# Patient Record
Sex: Female | Born: 1975 | Race: Black or African American | Hispanic: No | Marital: Single | Smoking: Former smoker
Health system: Southern US, Community
[De-identification: ages and names within clinical notes are randomized; demographics above are authoritative.]

## PROBLEM LIST (undated history)

## (undated) DIAGNOSIS — I10 Essential (primary) hypertension: Secondary | ICD-10-CM

## (undated) DIAGNOSIS — D500 Iron deficiency anemia secondary to blood loss (chronic): Secondary | ICD-10-CM

## (undated) DIAGNOSIS — N920 Excessive and frequent menstruation with regular cycle: Secondary | ICD-10-CM

## (undated) DIAGNOSIS — D259 Leiomyoma of uterus, unspecified: Secondary | ICD-10-CM

## (undated) DIAGNOSIS — Z973 Presence of spectacles and contact lenses: Secondary | ICD-10-CM

---

## 2007-06-27 ENCOUNTER — Emergency Department (HOSPITAL_COMMUNITY): Admission: EM | Admit: 2007-06-27 | Discharge: 2007-06-27 | Payer: Self-pay | Admitting: Family Medicine

## 2007-08-19 ENCOUNTER — Emergency Department (HOSPITAL_COMMUNITY): Admission: EM | Admit: 2007-08-19 | Discharge: 2007-08-19 | Payer: Self-pay | Admitting: Emergency Medicine

## 2009-10-30 ENCOUNTER — Emergency Department (HOSPITAL_COMMUNITY): Admission: EM | Admit: 2009-10-30 | Discharge: 2009-10-30 | Payer: Self-pay | Admitting: Emergency Medicine

## 2010-08-19 LAB — URINALYSIS, ROUTINE W REFLEX MICROSCOPIC
Glucose, UA: NEGATIVE mg/dL
Ketones, ur: 40 mg/dL — AB
Leukocytes, UA: NEGATIVE
Nitrite: NEGATIVE
Specific Gravity, Urine: 1.024 (ref 1.005–1.030)
pH: 6 (ref 5.0–8.0)

## 2010-08-19 LAB — POCT PREGNANCY, URINE: Preg Test, Ur: NEGATIVE

## 2010-08-19 LAB — URINE MICROSCOPIC-ADD ON

## 2010-08-19 LAB — RAPID STREP SCREEN (MED CTR MEBANE ONLY): Streptococcus, Group A Screen (Direct): NEGATIVE

## 2011-02-24 LAB — I-STAT 8, (EC8 V) (CONVERTED LAB)
Acid-Base Excess: 1
BUN: 6
Chloride: 104
Operator id: 295021
Sodium: 139
TCO2: 29
pCO2, Ven: 48.9
pH, Ven: 7.355 — ABNORMAL HIGH

## 2011-02-24 LAB — DIFFERENTIAL
Basophils Absolute: 0
Basophils Relative: 1
Eosinophils Absolute: 0.1
Eosinophils Relative: 2
Monocytes Relative: 8
Neutro Abs: 3.7
Neutrophils Relative %: 50

## 2011-02-24 LAB — URINALYSIS, ROUTINE W REFLEX MICROSCOPIC
Glucose, UA: NEGATIVE
Hgb urine dipstick: NEGATIVE
Ketones, ur: NEGATIVE
Protein, ur: NEGATIVE
Urobilinogen, UA: 2 — ABNORMAL HIGH

## 2011-02-24 LAB — POCT PREGNANCY, URINE: Operator id: 295021

## 2011-02-24 LAB — CBC: HCT: 40.6

## 2011-02-24 LAB — POCT I-STAT CREATININE: Creatinine, Ser: 1.1

## 2011-02-24 LAB — URINE MICROSCOPIC-ADD ON

## 2011-05-06 ENCOUNTER — Encounter: Payer: Self-pay | Admitting: *Deleted

## 2011-05-06 ENCOUNTER — Emergency Department (INDEPENDENT_AMBULATORY_CARE_PROVIDER_SITE_OTHER)
Admission: EM | Admit: 2011-05-06 | Discharge: 2011-05-06 | Disposition: A | Discharge status: Home / Self Care | Payer: Self-pay | Source: Home / Self Care | Attending: Family Medicine | Admitting: Family Medicine

## 2011-05-06 DIAGNOSIS — J209 Acute bronchitis, unspecified: Secondary | ICD-10-CM

## 2011-05-06 MED ORDER — AMOXICILLIN-POT CLAVULANATE 875-125 MG PO TABS: 1.0000 | ORAL_TABLET | Freq: Two times a day (BID) | ORAL | Status: AC

## 2011-05-06 MED ORDER — DM-GUAIFENESIN ER 60-1200 MG PO TB12: 1.0000 | ORAL_TABLET | Freq: Two times a day (BID) | ORAL | Status: AC

## 2011-05-06 NOTE — ED Provider Notes (Signed)
History     CSN: [id number] Arrival date & time: 05/06/2011  8:59 AM   First MD Initiated Contact with Patient 05/06/11 [phone]      Chief Complaint  Patient presents with  . URI    (Consider location/radiation/quality/duration/timing/severity/associated sxs/prior treatment) Patient is a 35 y.o. female presenting with URI. The history is provided by the patient.  URI The primary symptoms include sore throat and cough. Primary symptoms do not include fever, nausea or vomiting. The current episode started 3 to 5 days ago. This is a new problem.  Symptoms associated with the illness include congestion and rhinorrhea.    History reviewed. No pertinent past medical history.  Past Surgical History  Procedure Date  . Cesarean section   . Tubal ligation     No family history on file.  History  Substance Use Topics  . Smoking status: Current Everyday Smoker -- 0.5 packs/day  . Smokeless tobacco: Not on file  . Alcohol Use: Yes     social    OB History    Grav Para Term Preterm Abortions TAB SAB Ect Mult Living                  Review of Systems  Constitutional: Negative.  Negative for fever.  HENT: Positive for congestion, sore throat, rhinorrhea and postnasal drip.   Respiratory: Positive for cough.   Cardiovascular: Negative.   Gastrointestinal: Negative.  Negative for nausea and vomiting.  Skin: Negative.     Allergies  Review of patient's allergies indicates no known allergies.  Home Medications   Current Outpatient Rx  Name Route Sig Dispense Refill  . ALKA-SELTZER PLUS COLD & COUGH PO Oral Take by mouth.        BP 119/74  Pulse 74  Temp(Src) 98.4 F (36.9 C) (Oral)  Resp 16  SpO2 100%  LMP 04/22/2011  Physical Exam  Nursing note and vitals reviewed. Constitutional: She appears well-developed and well-nourished.  HENT:  Head: Normocephalic.  Right Ear: External ear normal.  Left Ear: External ear normal.  Nose: Nose normal.  Mouth/Throat:  Oropharynx is clear and moist.  Eyes: Pupils are equal, round, and reactive to light.  Neck: Normal range of motion. Neck supple.  Cardiovascular: Normal rate, regular rhythm, normal heart sounds and intact distal pulses.   Pulmonary/Chest: Effort normal and breath sounds normal.  Abdominal: Soft.  Skin: Skin is warm and dry.    ED Course  Procedures (including critical care time)  Labs Reviewed - No data to display No results found.   No diagnosis found.    MDM          Barkley Bruns, MD 05/06/11 1004

## 2011-05-06 NOTE — ED Notes (Signed)
Pt c/o productive cough, sinus congestion, sorethroat, bodyaches, chills onset 4 days ago.  States it was a gradual onset.  Throat red, not swollen and no exudate noted.

## 2013-06-06 ENCOUNTER — Encounter (HOSPITAL_COMMUNITY): Payer: Self-pay | Admitting: Emergency Medicine

## 2013-06-06 ENCOUNTER — Emergency Department (HOSPITAL_COMMUNITY)
Admission: EM | Admit: 2013-06-06 | Discharge: 2013-06-06 | Disposition: A | Discharge status: Home / Self Care | Payer: Self-pay | Attending: Emergency Medicine | Admitting: Emergency Medicine

## 2013-06-06 DIAGNOSIS — K047 Periapical abscess without sinus: Secondary | ICD-10-CM | POA: Insufficient documentation

## 2013-06-06 DIAGNOSIS — H9209 Otalgia, unspecified ear: Secondary | ICD-10-CM | POA: Insufficient documentation

## 2013-06-06 DIAGNOSIS — H748X9 Other specified disorders of middle ear and mastoid, unspecified ear: Secondary | ICD-10-CM | POA: Insufficient documentation

## 2013-06-06 DIAGNOSIS — F17200 Nicotine dependence, unspecified, uncomplicated: Secondary | ICD-10-CM | POA: Insufficient documentation

## 2013-06-06 MED ORDER — OXYCODONE-ACETAMINOPHEN 5-325 MG PO TABS: 1.0000 | ORAL_TABLET | Freq: Four times a day (QID) | ORAL | Status: DC | PRN

## 2013-06-06 MED ORDER — PENICILLIN V POTASSIUM 500 MG PO TABS: 1000.0000 mg | ORAL_TABLET | Freq: Two times a day (BID) | ORAL | Status: DC

## 2013-06-06 MED ORDER — IBUPROFEN 400 MG PO TABS
800.0000 mg | ORAL_TABLET | Freq: Once | ORAL | Status: AC
Start: 2013-06-06 — End: 2013-06-06
  Administered 2013-06-06: 800 mg via ORAL
  Filled 2013-06-06: qty 2

## 2013-06-06 NOTE — ED Provider Notes (Signed)
CSN: [id number]     Arrival date & time 06/06/13  1026 History  This chart was scribed for Jean Rack, PA-C, working with Richarda Blade, MD by Maree Erie, ED Scribe. This patient was seen in room TR04C/TR04C and the patient's care was started at 1:17 PM.    Chief Complaint  Patient presents with  . dental abscess     The history is provided by the patient. No language interpreter was used.    HPI Comments: Jean Ferguson is a 38 y.o. female who presents to the Emergency Department complaining of a constant, worsening dental abscess that appeared three days ago. She reports the swelling began in her gums but now affects the left cheek. She describes the pain as aching and rates it as a 8/10 currently. The pain is worsened by chewing as well as cold and hot food and drink. She has been taking pain medication without relief.  She states that she has a prior history of dental abscesses. She is usually discharged with antibiotics and pain medication which she reports relieves symptoms. She is also complaining of right sided ear pain. She denies fever, chills or throat swelling. She states that she is allergic to morphine.    History reviewed. No pertinent past medical history. Past Surgical History  Procedure Laterality Date  . Cesarean section    . Tubal ligation     No family history on file. History  Substance Use Topics  . Smoking status: Current Every Day Smoker -- 0.50 packs/day    Types: Cigarettes  . Smokeless tobacco: Not on file  . Alcohol Use: Yes     Comment: social   OB History   Grav Para Term Preterm Abortions TAB SAB Ect Mult Living                 Review of Systems  Constitutional: Negative for fever and chills.  HENT: Positive for dental problem and facial swelling. Negative for trouble swallowing.   All other systems reviewed and are negative.    Allergies  Morphine and related  Home Medications   Current Outpatient Rx  Name  Route  Sig   Dispense  Refill  . oxyCODONE-acetaminophen (PERCOCET) 5-325 MG per tablet   Oral   Take 1-2 tablets by mouth every 6 (six) hours as needed for severe pain.   15 tablet   0   . penicillin v potassium (VEETID) 500 MG tablet   Oral   Take 2 tablets (1,000 mg total) by mouth 2 (two) times daily. X 7 days   28 tablet   0    Triage Vitals: BP 131/72  Pulse 80  Temp(Src) 98.1 F (36.7 C) (Oral)  Resp 18  Wt 145 lb (65.772 kg)  SpO2 100%  LMP 06/05/2013  Physical Exam  Nursing note and vitals reviewed. Constitutional: She is oriented to person, place, and time. She appears well-developed and well-nourished. No distress.  HENT:  Head: Normocephalic and atraumatic.  Right Ear: Ear canal normal. Tympanic membrane is not injected and not erythematous. A middle ear effusion is present.  Left Ear: Tympanic membrane is not injected and not erythematous. A middle ear effusion is present.  Nose: Nose normal.  Mouth/Throat:     Submandibular adenopathy left side. No drainage at site of abscess in mouth.   Eyes: EOM are normal.  Neck: Neck supple. No tracheal deviation present.  Cardiovascular: Normal rate, regular rhythm and normal heart sounds.   Pulmonary/Chest: Effort normal  and breath sounds normal. No respiratory distress. She has no wheezes. She has no rales.  Musculoskeletal: Normal range of motion.  Lymphadenopathy:       Head (left side): Submandibular adenopathy present.  Neurological: She is alert and oriented to person, place, and time.  Skin: Skin is warm and dry.  Psychiatric: She has a normal mood and affect. Her behavior is normal.    ED Course  Procedures (including critical care time) DIAGNOSTIC STUDIES: Oxygen Saturation is 100% on room air, normal by my interpretation.    COORDINATION OF CARE: 1:21 PM -Will perform dental block. Patient verbalizes understanding and agrees with treatment plan.  1:45 PM -Performed dental block and I&D. Will give patient 800 mg  Ibuprofen and a prescription for antibiotics and pain medication. Patient verbalizes understanding and agrees with treatment plan.  INCISION AND DRAINAGE Performed by: Sherrie George Consent: Verbal consent obtained. Risks and benefits: risks, benefits and alternatives were discussed Type: dental abscess  Body area: Junction between Hard palate and Gum line on Left Side  Anesthesia: local infiltration   Incision was made with a scalpel.  Local anesthetic: Marcaine   Anesthetic total: 1 ml  Drainage: purulent  Drainage amount: 1 mL  Patient tolerance: Patient tolerated the procedure well with no immediate complications.     Labs Review Labs Reviewed - No data to display Imaging Review No results found.  EKG Interpretation   None       MDM   1. Dental abscess    Patient afebrile. VS WNL. Patient tolerated procedure well. Patient pain treated in ED. Discharged home with antibiotics and pain meds, with instructions to follow up with "on call" Dentist.  Patient agrees with plan.    Meds given in ED:  Medications  ibuprofen (ADVIL,MOTRIN) tablet 800 mg (800 mg Oral Given 06/06/13 1357)    Discharge Medication List as of 06/06/2013  1:54 PM    START taking these medications   Details  oxyCODONE-acetaminophen (PERCOCET) 5-325 MG per tablet Take 1-2 tablets by mouth every 6 (six) hours as needed for severe pain., Starting 06/06/2013, Until Discontinued, Print    penicillin v potassium (VEETID) 500 MG tablet Take 2 tablets (1,000 mg total) by mouth 2 (two) times daily. X 7 days, Starting 06/06/2013, Until Discontinued, Print         I personally performed the services described in this documentation, which was scribed in my presence. The recorded information has been reviewed and is accurate.    Sherrie George, PA-C 06/06/13 2127

## 2013-06-06 NOTE — ED Notes (Signed)
Patient states dental abscess upper L side.   Patient states no drainage, just swelling at this time.

## 2013-06-06 NOTE — ED Notes (Signed)
C/o left upper dental pain x 2 days. States has "a mouthful of broken teeth".

## 2013-06-08 NOTE — ED Provider Notes (Signed)
Medical screening examination/treatment/procedure(s) were performed by non-physician practitioner and as supervising physician I was immediately available for consultation/collaboration.  EKG Interpretation   None        Richarda Blade, MD 06/08/13 1712

## 2015-08-01 ENCOUNTER — Encounter (HOSPITAL_COMMUNITY): Payer: Self-pay | Admitting: Family Medicine

## 2015-08-01 ENCOUNTER — Emergency Department (HOSPITAL_COMMUNITY)
Admission: EM | Admit: 2015-08-01 | Discharge: 2015-08-01 | Disposition: A | Discharge status: Home / Self Care | Payer: BLUE CROSS/BLUE SHIELD | Attending: Emergency Medicine | Admitting: Emergency Medicine

## 2015-08-01 DIAGNOSIS — F17210 Nicotine dependence, cigarettes, uncomplicated: Secondary | ICD-10-CM | POA: Insufficient documentation

## 2015-08-01 DIAGNOSIS — J029 Acute pharyngitis, unspecified: Secondary | ICD-10-CM | POA: Diagnosis present

## 2015-08-01 DIAGNOSIS — J020 Streptococcal pharyngitis: Secondary | ICD-10-CM | POA: Diagnosis not present

## 2015-08-01 DIAGNOSIS — Z3202 Encounter for pregnancy test, result negative: Secondary | ICD-10-CM | POA: Diagnosis not present

## 2015-08-01 LAB — URINALYSIS, ROUTINE W REFLEX MICROSCOPIC
GLUCOSE, UA: NEGATIVE mg/dL
KETONES UR: NEGATIVE mg/dL
Leukocytes, UA: NEGATIVE
Nitrite: NEGATIVE
PROTEIN: 30 mg/dL — AB
Specific Gravity, Urine: >1.046 — ABNORMAL HIGH (ref 1.005–1.030)
pH: 6 (ref 5.0–8.0)

## 2015-08-01 LAB — COMPREHENSIVE METABOLIC PANEL
ALBUMIN: 3.2 g/dL — AB (ref 3.5–5.0)
ALK PHOS: 61 U/L (ref 38–126)
ALT: 17 U/L (ref 14–54)
ANION GAP: 11 (ref 5–15)
AST: 27 U/L (ref 15–41)
BILIRUBIN TOTAL: 0.3 mg/dL (ref 0.3–1.2)
BUN: 6 mg/dL (ref 6–20)
CALCIUM: 8.6 mg/dL — AB (ref 8.9–10.3)
CO2: 22 mmol/L (ref 22–32)
Chloride: 106 mmol/L (ref 101–111)
Creatinine, Ser: 0.76 mg/dL (ref 0.44–1.00)
GFR calc Af Amer: >60 mL/min (ref >60)
GFR calc non Af Amer: >60 mL/min (ref >60)
GLUCOSE: 99 mg/dL (ref 65–99)
Potassium: 3.6 mmol/L (ref 3.5–5.1)
Sodium: 139 mmol/L (ref 135–145)
TOTAL PROTEIN: 7 g/dL (ref 6.5–8.1)

## 2015-08-01 LAB — CBC WITH DIFFERENTIAL/PLATELET
BASOS PCT: 0 %
Basophils Absolute: 0 10*3/uL (ref 0.0–0.1)
Eosinophils Absolute: 0.1 10*3/uL (ref 0.0–0.7)
Eosinophils Relative: 0 %
HEMATOCRIT: 32.3 % — AB (ref 36.0–46.0)
HEMOGLOBIN: 9.9 g/dL — AB (ref 12.0–15.0)
LYMPHS PCT: 10 %
Lymphs Abs: 1.4 10*3/uL (ref 0.7–4.0)
MCH: 23.2 pg — ABNORMAL LOW (ref 26.0–34.0)
MCHC: 30.7 g/dL (ref 30.0–36.0)
MCV: 75.6 fL — ABNORMAL LOW (ref 78.0–100.0)
MONOS PCT: 9 %
Monocytes Absolute: 1.3 10*3/uL — ABNORMAL HIGH (ref 0.1–1.0)
NEUTROS ABS: 11.3 10*3/uL — AB (ref 1.7–7.7)
NEUTROS PCT: 81 %
Platelets: 341 10*3/uL (ref 150–400)
RBC: 4.27 MIL/uL (ref 3.87–5.11)
RDW: 18.3 % — ABNORMAL HIGH (ref 11.5–15.5)
WBC: 14.1 10*3/uL — ABNORMAL HIGH (ref 4.0–10.5)

## 2015-08-01 LAB — URINE MICROSCOPIC-ADD ON

## 2015-08-01 LAB — RAPID STREP SCREEN (MED CTR MEBANE ONLY): Streptococcus, Group A Screen (Direct): POSITIVE — AB

## 2015-08-01 LAB — POC URINE PREG, ED: Preg Test, Ur: NEGATIVE

## 2015-08-01 MED ORDER — SODIUM CHLORIDE 0.9 % IV BOLUS (SEPSIS)
1000.0000 mL | Freq: Once | INTRAVENOUS | Status: AC
  Administered 2015-08-01: 1000 mL via INTRAVENOUS

## 2015-08-01 MED ORDER — AMOXICILLIN-POT CLAVULANATE 875-125 MG PO TABS
1.0000 | ORAL_TABLET | Freq: Once | ORAL | Status: AC
  Administered 2015-08-01: 1 via ORAL
  Filled 2015-08-01: qty 1

## 2015-08-01 MED ORDER — AMOXICILLIN-POT CLAVULANATE 875-125 MG PO TABS: 1.0000 | ORAL_TABLET | Freq: Two times a day (BID) | ORAL | Status: DC

## 2015-08-01 MED ORDER — KETOROLAC TROMETHAMINE 30 MG/ML IJ SOLN
30.0000 mg | Freq: Once | INTRAMUSCULAR | Status: AC
  Administered 2015-08-01: 30 mg via INTRAVENOUS
  Filled 2015-08-01: qty 1

## 2015-08-01 MED ORDER — AMOXICILLIN 500 MG PO CAPS: 1000.0000 mg | ORAL_CAPSULE | Freq: Once | ORAL | Status: DC

## 2015-08-01 NOTE — ED Provider Notes (Signed)
CSN: [id number]     Arrival date & time 08/01/15  [id number] History   First MD Initiated Contact with Patient 08/01/15 [phone]     Chief Complaint  Patient presents with  . Influenza     (Consider location/radiation/quality/duration/timing/severity/associated sxs/prior Treatment) The history is provided by the patient.  Jean Ferguson is a 40 y.o. female with cough, chills, sore throat, vomiting. Patient states that about a week ago, she's been having some cold chills as well as some cough and occasional vomiting. She went out of town to [place] over the weekend and came back 2 days ago and symptoms worsened. Developed sore throat and subjective fevers. Denies abdominal pain or urinary symptoms.    History reviewed. No pertinent past medical history. Past Surgical History  Procedure Laterality Date  . Cesarean section    . Tubal ligation     History reviewed. No pertinent family history. Social History  Substance Use Topics  . Smoking status: Current Every Day Smoker -- 0.50 packs/day    Types: Cigarettes  . Smokeless tobacco: None  . Alcohol Use: Yes     Comment: social   OB History    No data available     Review of Systems  Constitutional: Positive for fever and chills.  HENT: Positive for sore throat.   Respiratory: Positive for cough.   All other systems reviewed and are negative.     Allergies  Morphine and related  Home Medications   Prior to Admission medications   Medication Sig Start Date End Date Taking? Authorizing Provider  oxyCODONE-acetaminophen (PERCOCET) 5-325 MG per tablet Take 1-2 tablets by mouth every 6 (six) hours as needed for severe pain. 06/06/13   Maude Leriche, PA-C  penicillin v potassium (VEETID) 500 MG tablet Take 2 tablets (1,000 mg total) by mouth 2 (two) times daily. X 7 days 06/06/13   Maude Leriche, PA-C   BP 137/80 mmHg  Pulse 67  Temp(Src) 98.4 F (36.9 C) (Oral)  Resp 19  SpO2 100%  LMP 08/01/2015 Physical Exam  Constitutional: She  is oriented to person, place, and time. She appears well-developed and well-nourished.  HENT:  Head: Normocephalic.  Tonsils enlarged with exudates, OP red. Uvula midline   Eyes: Conjunctivae are normal. Pupils are equal, round, and reactive to light.  Neck:  Mild cervical LAD   Cardiovascular: Normal rate, regular rhythm and normal heart sounds.   Pulmonary/Chest: Effort normal and breath sounds normal. No respiratory distress. She has no wheezes. She has no rales.  Abdominal: Soft. Bowel sounds are normal. She exhibits no distension. There is no tenderness. There is no rebound.  Musculoskeletal: Normal range of motion. She exhibits no edema or tenderness.  Neurological: She is alert and oriented to person, place, and time. No cranial nerve deficit. Coordination normal.  Skin: Skin is warm and dry.  Psychiatric: She has a normal mood and affect. Her behavior is normal. Judgment and thought content normal.  Nursing note and vitals reviewed.   ED Course  Procedures (including critical care time) Labs Review Labs Reviewed  RAPID STREP SCREEN (NOT AT [facility]) - Abnormal; Notable for the following:    Streptococcus, Group A Screen (Direct) POSITIVE (*)    All other components within normal limits  CBC WITH DIFFERENTIAL/PLATELET - Abnormal; Notable for the following:    WBC 14.1 (*)    Hemoglobin 9.9 (*)    HCT 32.3 (*)    MCV 75.6 (*)    MCH 23.2 (*)  RDW 18.3 (*)    Neutro Abs 11.3 (*)    Monocytes Absolute 1.3 (*)    All other components within normal limits  COMPREHENSIVE METABOLIC PANEL - Abnormal; Notable for the following:    Calcium 8.6 (*)    Albumin 3.2 (*)    All other components within normal limits  URINALYSIS, ROUTINE W REFLEX MICROSCOPIC (NOT AT [facility]) - Abnormal; Notable for the following:    Color, Urine AMBER (*)    Specific Gravity, Urine >1.046 (*)    Hgb urine dipstick MODERATE (*)    Bilirubin Urine SMALL (*)    Protein, ur 30 (*)    All other components  within normal limits  URINE MICROSCOPIC-ADD ON - Abnormal; Notable for the following:    Squamous Epithelial / LPF 0-5 (*)    Bacteria, UA FEW (*)    All other components within normal limits  POC URINE PREG, ED    Imaging Review No results found. I have personally reviewed and evaluated these images and lab results as part of my medical decision-making.   EKG Interpretation None      MDM   Final diagnoses:  None    Jean Ferguson is a 40 y.o. female here with subjective fevers, chills, cough, sore throat. Moderate centor score. Will get rapid strep. Consider viral syndrome vs strep. Afebrile here. Will get labs, UA, rapid strep, CXR. Will hydrate and reassess.   11:50 AM Patient felt better. WBC 14. Strep positive. Offered bicillin vs amoxicillin PO and prefers PO meds. Stable for dc.    Wandra Arthurs, MD 08/01/15 1150

## 2015-08-01 NOTE — ED Notes (Signed)
Pt here for flu like symptoms. sts started about 2 days ago. sts cold chills, cough, fever, vomiting.

## 2015-08-01 NOTE — Discharge Instructions (Signed)
Take augmentin twice daily for a week.   Stay hydrated.  Take motrin, tylenol for fever.   See your doctor   Return to ER if you have fever for a week, worse sore throat, difficulty swallowing.

## 2016-01-05 ENCOUNTER — Encounter (HOSPITAL_COMMUNITY): Payer: Self-pay | Admitting: *Deleted

## 2016-01-05 ENCOUNTER — Inpatient Hospital Stay (HOSPITAL_COMMUNITY)
Admission: AD | Admit: 2016-01-05 | Discharge: 2016-01-05 | Disposition: A | Discharge status: Home / Self Care | Payer: BLUE CROSS/BLUE SHIELD | Source: Ambulatory Visit | Attending: Family Medicine | Admitting: Family Medicine

## 2016-01-05 DIAGNOSIS — F17210 Nicotine dependence, cigarettes, uncomplicated: Secondary | ICD-10-CM | POA: Insufficient documentation

## 2016-01-05 DIAGNOSIS — D649 Anemia, unspecified: Secondary | ICD-10-CM | POA: Diagnosis not present

## 2016-01-05 DIAGNOSIS — N920 Excessive and frequent menstruation with regular cycle: Secondary | ICD-10-CM | POA: Insufficient documentation

## 2016-01-05 DIAGNOSIS — I10 Essential (primary) hypertension: Secondary | ICD-10-CM | POA: Diagnosis not present

## 2016-01-05 DIAGNOSIS — Z885 Allergy status to narcotic agent status: Secondary | ICD-10-CM | POA: Insufficient documentation

## 2016-01-05 DIAGNOSIS — R0989 Other specified symptoms and signs involving the circulatory and respiratory systems: Secondary | ICD-10-CM

## 2016-01-05 DIAGNOSIS — R42 Dizziness and giddiness: Secondary | ICD-10-CM | POA: Diagnosis not present

## 2016-01-05 DIAGNOSIS — R1110 Vomiting, unspecified: Secondary | ICD-10-CM | POA: Diagnosis present

## 2016-01-05 DIAGNOSIS — D500 Iron deficiency anemia secondary to blood loss (chronic): Secondary | ICD-10-CM

## 2016-01-05 LAB — URINE MICROSCOPIC-ADD ON: BACTERIA UA: NONE SEEN

## 2016-01-05 LAB — CBC WITH DIFFERENTIAL/PLATELET
Basophils Absolute: 0.1 10*3/uL (ref 0.0–0.1)
Basophils Relative: 1 %
Eosinophils Absolute: 0.1 10*3/uL (ref 0.0–0.7)
Eosinophils Relative: 2 %
HCT: 26 % — ABNORMAL LOW (ref 36.0–46.0)
Hemoglobin: 8 g/dL — ABNORMAL LOW (ref 12.0–15.0)
Lymphocytes Relative: 24 %
Lymphs Abs: 1.7 10*3/uL (ref 0.7–4.0)
MCH: 21.6 pg — ABNORMAL LOW (ref 26.0–34.0)
MCHC: 30.8 g/dL (ref 30.0–36.0)
MCV: 70.3 fL — ABNORMAL LOW (ref 78.0–100.0)
Monocytes Absolute: 0.3 10*3/uL (ref 0.1–1.0)
Monocytes Relative: 4 %
Neutro Abs: 5 10*3/uL (ref 1.7–7.7)
Neutrophils Relative %: 69 %
Other: 0 %
Platelets: 333 10*3/uL (ref 150–400)
RBC: 3.7 MIL/uL — ABNORMAL LOW (ref 3.87–5.11)
RDW: 19 % — ABNORMAL HIGH (ref 11.5–15.5)
WBC: 7.2 10*3/uL (ref 4.0–10.5)

## 2016-01-05 LAB — URINALYSIS, ROUTINE W REFLEX MICROSCOPIC
BILIRUBIN URINE: NEGATIVE
GLUCOSE, UA: NEGATIVE mg/dL
KETONES UR: NEGATIVE mg/dL
Leukocytes, UA: NEGATIVE
Nitrite: NEGATIVE
PROTEIN: NEGATIVE mg/dL
Specific Gravity, Urine: 1.02 (ref 1.005–1.030)
pH: 6.5 (ref 5.0–8.0)

## 2016-01-05 LAB — COMPREHENSIVE METABOLIC PANEL
ALT: 13 U/L — ABNORMAL LOW (ref 14–54)
AST: 21 U/L (ref 15–41)
Albumin: 3.6 g/dL (ref 3.5–5.0)
Alkaline Phosphatase: 46 U/L (ref 38–126)
Anion gap: 6 (ref 5–15)
BILIRUBIN TOTAL: 0.2 mg/dL — AB (ref 0.3–1.2)
BUN: 13 mg/dL (ref 6–20)
CO2: 25 mmol/L (ref 22–32)
Calcium: 8.5 mg/dL — ABNORMAL LOW (ref 8.9–10.3)
Chloride: 105 mmol/L (ref 101–111)
Creatinine, Ser: 0.83 mg/dL (ref 0.44–1.00)
Glucose, Bld: 103 mg/dL — ABNORMAL HIGH (ref 65–99)
POTASSIUM: 3.8 mmol/L (ref 3.5–5.1)
Sodium: 136 mmol/L (ref 135–145)
Total Protein: 7.1 g/dL (ref 6.5–8.1)

## 2016-01-05 LAB — GLUCOSE, CAPILLARY: Glucose-Capillary: 111 mg/dL — ABNORMAL HIGH (ref 65–99)

## 2016-01-05 LAB — POCT PREGNANCY, URINE: Preg Test, Ur: NEGATIVE

## 2016-01-05 MED ORDER — MEGESTROL ACETATE 40 MG PO TABS: 40.0000 mg | ORAL_TABLET | Freq: Every day | ORAL | 5 refills | Status: DC

## 2016-01-05 MED ORDER — MECLIZINE HCL 25 MG PO TABS
25.0000 mg | ORAL_TABLET | Freq: Once | ORAL | Status: AC
  Administered 2016-01-05: 25 mg via ORAL
  Filled 2016-01-05: qty 1

## 2016-01-05 NOTE — Discharge Instructions (Signed)
Dizziness Dizziness is a common problem. It is a feeling of unsteadiness or light-headedness. You may feel like you are about to faint. Dizziness can lead to injury if you stumble or fall. Anyone can become dizzy, but dizziness is more common in older adults. This condition can be caused by a number of things, including medicines, dehydration, or illness. HOME CARE INSTRUCTIONS Taking these steps may help with your condition: Eating and Drinking  Drink enough fluid to keep your urine clear or pale yellow. This helps to keep you from becoming dehydrated. Try to drink more clear fluids, such as water.  Do not drink alcohol.  Limit your caffeine intake if directed by your health care provider.  Limit your salt intake if directed by your health care provider. Activity  Avoid making quick movements.  Rise slowly from chairs and steady yourself until you feel okay.  In the morning, first sit up on the side of the bed. When you feel okay, stand slowly while you hold onto something until you know that your balance is fine.  Move your legs often if you need to stand in one place for a long time. Tighten and relax your muscles in your legs while you are standing.  Do not drive or operate heavy machinery if you feel dizzy.  Avoid bending down if you feel dizzy. Place items in your home so that they are easy for you to reach without leaning over. Lifestyle  Do not use any tobacco products, including cigarettes, chewing tobacco, or electronic cigarettes. If you need help quitting, ask your health care provider.  Try to reduce your stress level, such as with yoga or meditation. Talk with your health care provider if you need help. General Instructions  Watch your dizziness for any changes.  Take medicines only as directed by your health care provider. Talk with your health care provider if you think that your dizziness is caused by a medicine that you are taking.  Tell a friend or a family  member that you are feeling dizzy. If he or she notices any changes in your behavior, have this person call your health care provider.  Keep all follow-up visits as directed by your health care provider. This is important. SEEK MEDICAL CARE IF:  Your dizziness does not go away.  Your dizziness or light-headedness gets worse.  You feel nauseous.  You have reduced hearing.  You have new symptoms.  You are unsteady on your feet or you feel like the room is spinning. SEEK IMMEDIATE MEDICAL CARE IF:  You vomit or have diarrhea and are unable to eat or drink anything.  You have problems talking, walking, swallowing, or using your arms, hands, or legs.  You feel generally weak.  You are not thinking clearly or you have trouble forming sentences. It may take a friend or family member to notice this.  You have chest pain, abdominal pain, shortness of breath, or sweating.  Your vision changes.  You notice any bleeding.  You have a headache.  You have neck pain or a stiff neck.  You have a fever.   This information is not intended to replace advice given to you by your health care provider. Make sure you discuss any questions you have with your health care provider.   Document Released: 11/12/2000 Document Revised: 10/03/2014 Document Reviewed: 05/15/2014 Elsevier Interactive Patient Education 2016 [employer] Anemia, Nonspecific Anemia is a condition in which the concentration of red blood cells or hemoglobin in the blood  is below normal. Hemoglobin is a substance in red blood cells that carries oxygen to the tissues of the body. Anemia results in not enough oxygen reaching these tissues.  CAUSES  Common causes of anemia include:   Excessive bleeding. Bleeding may be internal or external. This includes excessive bleeding from periods (in women) or from the intestine.   Poor nutrition.   Chronic kidney, thyroid, and liver disease.  Bone marrow disorders that decrease  red blood cell production.  Cancer and treatments for cancer.  HIV, AIDS, and their treatments.  Spleen problems that increase red blood cell destruction.  Blood disorders.  Excess destruction of red blood cells due to infection, medicines, and autoimmune disorders. SIGNS AND SYMPTOMS   Minor weakness.   Dizziness.   Headache.  Palpitations.   Shortness of breath, especially with exercise.   Paleness.  Cold sensitivity.  Indigestion.  Nausea.  Difficulty sleeping.  Difficulty concentrating. Symptoms may occur suddenly or they may develop slowly.  DIAGNOSIS  Additional blood tests are often needed. These help your health care provider determine the best treatment. Your health care provider will check your stool for blood and look for other causes of blood loss.  TREATMENT  Treatment varies depending on the cause of the anemia. Treatment can include:   Supplements of iron, vitamin 123456, or folic acid.   Hormone medicines.   A blood transfusion. This may be needed if blood loss is severe.   Hospitalization. This may be needed if there is significant continual blood loss.   Dietary changes.  Spleen removal. HOME CARE INSTRUCTIONS Keep all follow-up appointments. It often takes many weeks to correct anemia, and having your health care provider check on your condition and your response to treatment is very important. SEEK IMMEDIATE MEDICAL CARE IF:   You develop extreme weakness, shortness of breath, or chest pain.   You become dizzy or have trouble concentrating.  You develop heavy vaginal bleeding.   You develop a rash.   You have bloody or black, tarry stools.   You faint.   You vomit up blood.   You vomit repeatedly.   You have abdominal pain.  You have a fever or persistent symptoms for more than 2-3 days.   You have a fever and your symptoms suddenly get worse.   You are dehydrated.  MAKE SURE YOU:  Understand these  instructions.  Will watch your condition.  Will get help right away if you are not doing well or get worse.   This information is not intended to replace advice given to you by your health care provider. Make sure you discuss any questions you have with your health care provider.   Document Released: 06/26/2004 Document Revised: 01/19/2013 Document Reviewed: 11/12/2012 Elsevier Interactive Patient Education [employer]. Menorrhagia Menorrhagia is the medical term for when your menstrual periods are heavy or last longer than usual. With menorrhagia, every period you have may cause enough blood loss and cramping that you are unable to maintain your usual activities. CAUSES  In some cases, the cause of heavy periods is unknown, but a number of conditions may cause menorrhagia. Common causes include:  A problem with the hormone-producing thyroid gland (hypothyroid).  Noncancerous growths in the uterus (polyps or fibroids).  An imbalance of the estrogen and progesterone hormones.  One of your ovaries not releasing an egg during one or more months.  Side effects of having an intrauterine device (IUD).  Side effects of some medicines, such as anti-inflammatory  medicines or blood thinners.  A bleeding disorder that stops your blood from clotting normally. SIGNS AND SYMPTOMS  During a normal period, bleeding lasts between 4 and 8 days. Signs that your periods are too heavy include:  You routinely have to change your pad or tampon every 1 or 2 hours because it is completely soaked.  You pass blood clots larger than 1 inch (2.5 cm) in size.  You have bleeding for more than 7 days.  You need to use pads and tampons at the same time because of heavy bleeding.  You need to wake up to change your pads or tampons during the night.  You have symptoms of anemia, such as tiredness, fatigue, or shortness of breath. DIAGNOSIS  Your health care provider will perform a physical exam and  ask you questions about your symptoms and menstrual history. Other tests may be ordered based on what the health care provider finds during the exam. These tests can include:  Blood tests. Blood tests are used to check if you are pregnant or have hormonal changes, a bleeding or thyroid disorder, low iron levels (anemia), or other problems.  Endometrial biopsy. Your health care provider takes a sample of tissue from the inside of your uterus to be examined under a microscope.  Pelvic ultrasound. This test uses sound waves to make a picture of your uterus, ovaries, and vagina. The pictures can show if you have fibroids or other growths.  Hysteroscopy. For this test, your health care provider will use a small telescope to look inside your uterus. Based on the results of your initial tests, your health care provider may recommend further testing. TREATMENT  Treatment may not be needed. If it is needed, your health care provider may recommend treatment with one or more medicines first. If these do not reduce bleeding enough, a surgical treatment might be an option. The best treatment for you will depend on:   Whether you need to prevent pregnancy.  Your desire to have children in the future.  The cause and severity of your bleeding.  Your opinion and personal preference.  Medicines for menorrhagia may include:  Birth control methods that use hormones. These include the pill, skin patch, vaginal ring, shots that you get every 3 months, hormonal IUD, and implant. These treatments reduce bleeding during your menstrual period.  Medicines that thicken blood and slow bleeding.  Medicines that reduce swelling, such as ibuprofen.  Medicines that contain a synthetic hormone called progestin.   Medicines that make the ovaries stop working for a short time.  You may need surgical treatment for menorrhagia if the medicines are unsuccessful. Treatment options include:  Dilation and curettage  (D&C). In this procedure, your health care provider opens (dilates) your cervix and then scrapes or suctions tissue from the lining of your uterus to reduce menstrual bleeding.  Operative hysteroscopy. This procedure uses a tiny tube with a light (hysteroscope) to view your uterine cavity and can help in the surgical removal of a polyp that may be causing heavy periods.  Endometrial ablation. Through various techniques, your health care provider permanently destroys the entire lining of your uterus (endometrium). After endometrial ablation, most women have little or no menstrual flow. Endometrial ablation reduces your ability to become pregnant.  Endometrial resection. This surgical procedure uses an electrosurgical wire loop to remove the lining of the uterus. This procedure also reduces your ability to become pregnant.  Hysterectomy. Surgical removal of the uterus and cervix is a permanent procedure  that stops menstrual periods. Pregnancy is not possible after a hysterectomy. This procedure requires anesthesia and hospitalization. HOME CARE INSTRUCTIONS   Only take over-the-counter or prescription medicines as directed by your health care provider. Take prescribed medicines exactly as directed. Do not change or switch medicines without consulting your health care provider.  Take any prescribed iron pills exactly as directed by your health care provider. Long-term heavy bleeding may result in low iron levels. Iron pills help replace the iron your body lost from heavy bleeding. Iron may cause constipation. If this becomes a problem, increase the bran, fruits, and roughage in your diet.  Do not take aspirin or medicines that contain aspirin 1 week before or during your menstrual period. Aspirin may make the bleeding worse.  If you need to change your sanitary pad or tampon more than once every 2 hours, stay in bed and rest as much as possible until the bleeding stops.  Eat well-balanced meals. Eat  foods high in iron. Examples are leafy green vegetables, meat, liver, eggs, and whole grain breads and cereals. Do not try to lose weight until the abnormal bleeding has stopped and your blood iron level is back to normal. SEEK MEDICAL CARE IF:   You soak through a pad or tampon every 1 or 2 hours, and this happens every time you have a period.  You need to use pads and tampons at the same time because you are bleeding so much.  You need to change your pad or tampon during the night.  You have a period that lasts for more than 8 days.  You pass clots bigger than 1 inch wide.  You have irregular periods that happen more or less often than once a month.  You feel dizzy or faint.  You feel very weak or tired.  You feel short of breath or feel your heart is beating too fast when you exercise.  You have nausea and vomiting or diarrhea while you are taking your medicine.  You have any problems that may be related to the medicine you are taking. SEEK IMMEDIATE MEDICAL CARE IF:   You soak through 4 or more pads or tampons in 2 hours.  You have any bleeding while you are pregnant. MAKE SURE YOU:   Understand these instructions.  Will watch your condition.  Will get help right away if you are not doing well or get worse.   This information is not intended to replace advice given to you by your health care provider. Make sure you discuss any questions you have with your health care provider.   Document Released: 05/19/2005 Document Revised: 05/24/2013 Document Reviewed: 11/07/2012 Elsevier Interactive Patient Education [employer].

## 2016-01-05 NOTE — MAU Provider Note (Signed)
Chief Complaint:  Emesis   First Provider Initiated Contact with Patient 01/05/16 [phone]     HPI  Jean Ferguson is a 40 y.o. [id number] who presents to maternity admissions reporting dizziness which occurred last night. States her BP was elevated also.  Had two episodes of vomiting, which occurred after dizziness.  Has been able to keep down food and fluids.  No fever or other illness.She reports no vaginal itching/burning, urinary symptoms, h/a, dizziness, n/v, or fever/chills.  Did start her period   Past Medical History: Past Medical History:  Diagnosis Date  . Medical history non-contributory     Past obstetric history: OB History  Gravida Para Term Preterm AB Living  3 3   3   3   SAB TAB Ectopic Multiple Live Births          3    # Outcome Date GA Lbr Len/2nd Weight Sex Delivery Anes PTL Lv  3 Preterm 09/15/95     CS-LTranv     2 Preterm 09/29/94     CS-LTranv     1 Preterm 03/06/92     CS-LTranv         Past Surgical History: Past Surgical History:  Procedure Laterality Date  . CESAREAN SECTION    . TUBAL LIGATION      Family History: History reviewed. No pertinent family history.  Social History: Social History  Substance Use Topics  . Smoking status: Current Every Day Smoker    Packs/day: 0.50    Types: Cigarettes  . Smokeless tobacco: Never Used  . Alcohol use Yes     Comment: social    Allergies:  Allergies  Allergen Reactions  . Morphine And Related     Pt states she feels like she is on fire inside    Meds:  Prescriptions Prior to Admission  Medication Sig Dispense Refill Last Dose  . amoxicillin-clavulanate (AUGMENTIN) 875-125 MG tablet Take 1 tablet by mouth 2 (two) times daily. One po bid x 7 days 14 tablet 0     I have reviewed patient's Past Medical Hx, Surgical Hx, Family Hx, Social Hx, medications and allergies.  ROS:  Review of Systems  Constitutional: Positive for fatigue. Negative for chills and fever.  Respiratory: Negative for  shortness of breath.   Gastrointestinal: Positive for nausea. Negative for abdominal pain, constipation, diarrhea and vomiting.  Genitourinary: Positive for vaginal bleeding. Negative for dysuria, pelvic pain and vaginal discharge.  Neurological: Positive for dizziness.   Other systems negative     Physical Exam  Patient Vitals for the past 24 hrs:  BP Temp Temp src Pulse Resp SpO2  01/05/16 0808 129/75 97.7 F (36.5 C) Oral 63 16 100 %   Constitutional: Well-developed, well-nourished female in no acute distress.  Cardiovascular: normal rate and rhythm, no ectopy audible, S1 & S2 heard, no murmur Respiratory: normal effort, no distress. Lungs CTAB with no wheezes or crackles GI: Abd soft, non-tender.  Nondistended.  No rebound, No guarding.  Bowel Sounds audible  MS: Extremities nontender, no edema, normal ROM Neurologic: Alert and oriented x 4.   Grossly nonfocal. GU: Neg CVAT. Skin:  Warm and Dry Psych:  Affect appropriate.    Labs: Results for orders placed or performed during the hospital encounter of 01/05/16 (from the past 24 hour(s))  Urinalysis, Routine w reflex microscopic (not at [facility])     Status: Abnormal   Collection Time: 01/05/16  8:00 AM  Result Value Ref Range   Color, Urine YELLOW  YELLOW   APPearance CLEAR CLEAR   Specific Gravity, Urine 1.020 1.005 - 1.030   pH 6.5 5.0 - 8.0   Glucose, UA NEGATIVE NEGATIVE mg/dL   Hgb urine dipstick TRACE (A) NEGATIVE   Bilirubin Urine NEGATIVE NEGATIVE   Ketones, ur NEGATIVE NEGATIVE mg/dL   Protein, ur NEGATIVE NEGATIVE mg/dL   Nitrite NEGATIVE NEGATIVE   Leukocytes, UA NEGATIVE NEGATIVE  Urine microscopic-add on     Status: Abnormal   Collection Time: 01/05/16  8:00 AM  Result Value Ref Range   Squamous Epithelial / LPF 0-5 (A) NONE SEEN   WBC, UA 0-5 0 - 5 WBC/hpf   RBC / HPF 0-5 0 - 5 RBC/hpf   Bacteria, UA NONE SEEN NONE SEEN  Pregnancy, urine POC     Status: None   Collection Time: 01/05/16  8:05 AM  Result  Value Ref Range   Preg Test, Ur NEGATIVE NEGATIVE  Glucose, capillary     Status: Abnormal   Collection Time: 01/05/16  8:21 AM  Result Value Ref Range   Glucose-Capillary 111 (H) 65 - 99 mg/dL  CBC with Differential/Platelet     Status: Abnormal   Collection Time: 01/05/16  8:44 AM  Result Value Ref Range   WBC 7.2 4.0 - 10.5 K/uL   RBC 3.70 (L) 3.87 - 5.11 MIL/uL   Hemoglobin 8.0 (L) 12.0 - 15.0 g/dL   HCT 26.0 (L) 36.0 - 46.0 %   MCV 70.3 (L) 78.0 - 100.0 fL   MCH 21.6 (L) 26.0 - 34.0 pg   MCHC 30.8 30.0 - 36.0 g/dL   RDW 19.0 (H) 11.5 - 15.5 %   Platelets 333 150 - 400 K/uL   Neutrophils Relative % 69 %   Lymphocytes Relative 24 %   Monocytes Relative 4 %   Eosinophils Relative 2 %   Basophils Relative 1 %   Other 0 %   Neutro Abs 5.0 1.7 - 7.7 K/uL   Lymphs Abs 1.7 0.7 - 4.0 K/uL   Monocytes Absolute 0.3 0.1 - 1.0 K/uL   Eosinophils Absolute 0.1 0.0 - 0.7 K/uL   Basophils Absolute 0.1 0.0 - 0.1 K/uL   RBC Morphology SCHISTOCYTES NOTED ON SMEAR   Comprehensive metabolic panel     Status: Abnormal   Collection Time: 01/05/16  8:44 AM  Result Value Ref Range   Sodium 136 135 - 145 mmol/L   Potassium 3.8 3.5 - 5.1 mmol/L   Chloride 105 101 - 111 mmol/L   CO2 25 22 - 32 mmol/L   Glucose, Bld 103 (H) 65 - 99 mg/dL   BUN 13 6 - 20 mg/dL   Creatinine, Ser 0.83 0.44 - 1.00 mg/dL   Calcium 8.5 (L) 8.9 - 10.3 mg/dL   Total Protein 7.1 6.5 - 8.1 g/dL   Albumin 3.6 3.5 - 5.0 g/dL   AST 21 15 - 41 U/L   ALT 13 (L) 14 - 54 U/L   Alkaline Phosphatase 46 38 - 126 U/L   Total Bilirubin 0.2 (L) 0.3 - 1.2 mg/dL   GFR calc non Af Amer >60 >60 mL/min   GFR calc Af Amer >60 >60 mL/min   Anion gap 6 5 - 15    Imaging:  No results found.  MAU Course/MDM: I have ordered labs as follows: See above Imaging ordered: outpatient [place] Results reviewed.   Discussed low hemoglobin as possible cause of dizziness.  Review of past Hgb, shows more anemia.  Pt now admits to  heavy menses  lasting 12 days each month (regular cycles) for past few years.  Has never mentioned to doctor.   Discussed I will request outpt [place] to rule out polyp or fibroids, and f/u in clinic for plan. Will start on Megace.   8.0  9.9    13.6R  01/05/16    08/01/15                          08/19/07  Also needs a family MD to evaluate her hypertension Pt stable at time of discharge.  Assessment: Dizziness Anemia Menorrhagia Labile hypertension  Plan: Discharge home Recommend get appt with family medicine to evaluate HTN Referred to clinic for plan for DUB/anemia Rx sent for Megace  for DUB PO hydration   Encouraged to return here or to other Urgent Care/ED if she develops worsening of symptoms, increase in pain, fever, or other concerning symptoms.   Hansel Feinstein CNM, MSN Certified Nurse-Midwife 01/05/2016 8:48 AM

## 2016-01-05 NOTE — MAU Note (Signed)
Pt states she is very dizzy and has thrown up twice.  Pt states she checked her BP and it was 150/89.  Pt states she does not have a headache.  Pt states when she closes her eyes she feels like she is falling.  Pt states she knows something isn't right.

## 2016-01-11 ENCOUNTER — Other Ambulatory Visit (HOSPITAL_COMMUNITY): Payer: Self-pay | Admitting: Advanced Practice Midwife

## 2016-01-11 DIAGNOSIS — N938 Other specified abnormal uterine and vaginal bleeding: Secondary | ICD-10-CM

## 2016-01-11 NOTE — Progress Notes (Unsigned)
Order added for transvag US

## 2016-01-21 ENCOUNTER — Ambulatory Visit: Payer: BLUE CROSS/BLUE SHIELD | Admitting: Obstetrics & Gynecology

## 2016-01-21 ENCOUNTER — Ambulatory Visit (HOSPITAL_COMMUNITY)
Admission: RE | Admit: 2016-01-21 | Discharge: 2016-01-21 | Disposition: A | Discharge status: Home / Self Care | Payer: BLUE CROSS/BLUE SHIELD | Source: Ambulatory Visit | Attending: Advanced Practice Midwife | Admitting: Advanced Practice Midwife

## 2016-01-21 DIAGNOSIS — N858 Other specified noninflammatory disorders of uterus: Secondary | ICD-10-CM | POA: Insufficient documentation

## 2016-01-21 DIAGNOSIS — D500 Iron deficiency anemia secondary to blood loss (chronic): Secondary | ICD-10-CM | POA: Diagnosis not present

## 2016-01-21 DIAGNOSIS — D259 Leiomyoma of uterus, unspecified: Secondary | ICD-10-CM | POA: Diagnosis not present

## 2016-01-21 DIAGNOSIS — N938 Other specified abnormal uterine and vaginal bleeding: Secondary | ICD-10-CM

## 2016-01-21 DIAGNOSIS — N920 Excessive and frequent menstruation with regular cycle: Secondary | ICD-10-CM | POA: Insufficient documentation

## 2016-01-23 ENCOUNTER — Encounter: Payer: Self-pay | Admitting: *Deleted

## 2016-01-27 ENCOUNTER — Encounter: Payer: Self-pay | Admitting: Advanced Practice Midwife

## 2016-01-27 DIAGNOSIS — D259 Leiomyoma of uterus, unspecified: Secondary | ICD-10-CM | POA: Insufficient documentation

## 2016-02-06 ENCOUNTER — Encounter: Payer: Self-pay | Admitting: Family Medicine

## 2016-02-06 ENCOUNTER — Ambulatory Visit (INDEPENDENT_AMBULATORY_CARE_PROVIDER_SITE_OTHER): Payer: BLUE CROSS/BLUE SHIELD | Admitting: Family Medicine

## 2016-02-06 ENCOUNTER — Other Ambulatory Visit (HOSPITAL_COMMUNITY)
Admission: RE | Admit: 2016-02-06 | Discharge: 2016-02-06 | Disposition: A | Discharge status: Home / Self Care | Payer: BLUE CROSS/BLUE SHIELD | Source: Ambulatory Visit | Attending: Family Medicine | Admitting: Family Medicine

## 2016-02-06 VITALS — BP 139/80 | HR 86 | Ht 63.0 in | Wt 149.7 lb

## 2016-02-06 DIAGNOSIS — N920 Excessive and frequent menstruation with regular cycle: Secondary | ICD-10-CM | POA: Diagnosis not present

## 2016-02-06 DIAGNOSIS — D500 Iron deficiency anemia secondary to blood loss (chronic): Secondary | ICD-10-CM | POA: Diagnosis not present

## 2016-02-06 DIAGNOSIS — Z30430 Encounter for insertion of intrauterine contraceptive device: Secondary | ICD-10-CM | POA: Diagnosis not present

## 2016-02-06 DIAGNOSIS — Z124 Encounter for screening for malignant neoplasm of cervix: Secondary | ICD-10-CM | POA: Diagnosis not present

## 2016-02-06 DIAGNOSIS — Z1151 Encounter for screening for human papillomavirus (HPV): Secondary | ICD-10-CM

## 2016-02-06 DIAGNOSIS — D251 Intramural leiomyoma of uterus: Secondary | ICD-10-CM

## 2016-02-06 LAB — CBC
HEMATOCRIT: 27.7 % — AB (ref 35.0–45.0)
Hemoglobin: 8.2 g/dL — ABNORMAL LOW (ref 11.7–15.5)
MCH: 21.2 pg — ABNORMAL LOW (ref 27.0–33.0)
MCHC: 29.6 g/dL — AB (ref 32.0–36.0)
MCV: 71.8 fL — ABNORMAL LOW (ref 80.0–100.0)
MPV: 9.8 fL (ref 7.5–12.5)
PLATELETS: 487 10*3/uL — AB (ref 140–400)
RBC: 3.86 MIL/uL (ref 3.80–5.10)
RDW: 17.9 % — AB (ref 11.0–15.0)
WBC: 7.8 10*3/uL (ref 3.8–10.8)

## 2016-02-06 LAB — POCT PREGNANCY, URINE: PREG TEST UR: NEGATIVE

## 2016-02-06 MED ORDER — LEVONORGESTREL 18.6 MCG/DAY IU IUD
INTRAUTERINE_SYSTEM | Freq: Once | INTRAUTERINE | Status: AC
  Administered 2016-02-06: 1 via INTRAUTERINE

## 2016-02-06 NOTE — Assessment & Plan Note (Signed)
Check TSH and CBC

## 2016-02-06 NOTE — Progress Notes (Signed)
Subjective:    Patient ID: Jean Ferguson is a 61 y.[id number]  female presenting with Fibroids  on 02/06/2016  HPI: Reports 4 year h/o pain and bleeding. Came to ED with bleeding and noted to have a hgb of 8. She was started on megace 40 mg daily. Noted increasing bleeding on the meds, so she stopped them. Had u/s that revealed fibroid uterus. 11 cm, largest fibroid, likely subserosal and 3.9 cm. Cannot hold bayer or ibuprofen down.  Past Surgical History:  Procedure Laterality Date  . CESAREAN SECTION    . TUBAL LIGATION     Past Medical History:  Diagnosis Date  . Medical history non-contributory    Allergies  Allergen Reactions  . Morphine And Related     Pt states she feels like she is on fire inside   Current Outpatient Prescriptions on File Prior to Visit  Medication Sig Dispense Refill  . acetaminophen (TYLENOL) 500 MG tablet Take 500 mg by mouth every 6 (six) hours as needed for moderate pain.    . megestrol (MEGACE) 40 MG tablet Take 1 tablet (40 mg total) by mouth daily. (Patient not taking: Reported on 02/06/2016) 60 tablet 5   No current facility-administered medications on file prior to visit.      Review of Systems  Constitutional: Positive for fatigue. Negative for chills and fever.  Respiratory: Positive for shortness of breath.   Cardiovascular: Negative for chest pain.  Gastrointestinal: Positive for abdominal pain. Negative for nausea and vomiting.  Genitourinary: Positive for menstrual problem and vaginal bleeding. Negative for dysuria.  Skin: Negative for rash.      Objective:    BP 139/80 (BP Location: Right Arm, Patient Position: Sitting, Cuff Size: Normal)   Pulse 86   Ht 5\' 3"  (1.6 m)   Wt 149 lb 11.2 oz (67.9 kg)   LMP 12/24/2015 (Approximate) Comment: bleeding currently  BMI 26.52 kg/m  Physical Exam  Constitutional: She is oriented to person, place, and time. She appears well-developed and well-nourished. No distress.  HENT:  Head:  Normocephalic and atraumatic.  Eyes: No scleral icterus.  Neck: Neck supple.  Cardiovascular: Normal rate.   Pulmonary/Chest: Effort normal.  Abdominal: Soft.  Genitourinary:  Genitourinary Comments: BUS normal, vagina is pink and rugated, blood noted in vault cervix is nulliparous without lesion   Neurological: She is alert and oriented to person, place, and time.  Skin: Skin is warm and dry.  Psychiatric: She has a normal mood and affect.   Procedure: Patient given informed consent, signed copy in the chart, time out was performed. Appropriate time out taken. The patient was placed in the lithotomy position and the cervix brought into view with sterile speculum.  Portio of cervix cleansed x 2 with betadine swabs.  A tenaculum was placed in the anterior lip of the cervix.  The uterus was sounded for depth of 7 cm. A pipelle was introduced to into the uterus, suction created,  and an endometrial sample was obtained. All equipment was removed and accounted for.  The patient tolerated the procedure well.   Procedure: Patient identified, informed consent performed, signed copy in chart, time out was performed.  Urine pregnancy test negative. Liletta IUD placed per manufacturer's recommendations.  Strings trimmed to 3 cm.   Patient given post procedure instructions and Liletta care card with expiration date.         Assessment & Plan:   Problem List Items Addressed This Visit      Unprioritized  Chronic blood loss anemia    Check TSH and CBC      Relevant Orders   CBC   Menorrhagia    After careful consideration of options, patient elected for IUD placement. Other options including uterine fibroid embolization, HTA, continued attempts at oral meds discussed. Patient works 2 jobs and would not be able to take time off from work. 3 prior C-sections but a candidate for TVH. Limitations and failure of other options discussed.      Relevant Orders   Surgical pathology   TSH      Other Visit Diagnoses    Screening for cervical cancer    -  Primary   Relevant Orders   Cytology - PAP      Total face-to-face time with patient: 25 minutes. Over 50% of encounter was spent on counseling and coordination of care. Return in about 6 weeks (around 03/19/2016) for iud check, a follow-up.  Donnamae Jude 02/06/2016 1:46 PM

## 2016-02-06 NOTE — Patient Instructions (Signed)
Uterine Fibroids Uterine fibroids are tissue masses (tumors) that can develop in the womb (uterus). They are also called leiomyomas. This type of tumor is not cancerous (benign) and does not spread to other parts of the body outside of the pelvic area, which is between the hip bones. Occasionally, fibroids may develop in the fallopian tubes, in the cervix, or on the support structures (ligaments) that surround the uterus. You can have one or many fibroids. Fibroids can vary in size, weight, and where they grow in the uterus. Some can become quite large. Most fibroids do not require medical treatment. CAUSES A fibroid can develop when a single uterine cell keeps growing (replicating). Most cells in the human body have a control mechanism that keeps them from replicating without control. SIGNS AND SYMPTOMS Symptoms may include:   Heavy bleeding during your period.  Bleeding or spotting between periods.  Pelvic pain and pressure.  Bladder problems, such as needing to urinate more often (urinary frequency) or urgently.  Inability to reproduce offspring (infertility).  Miscarriages. DIAGNOSIS Uterine fibroids are diagnosed through a physical exam. Your health care provider may feel the lumpy tumors during a pelvic exam. Ultrasonography and an MRI may be done to determine the size, location, and number of fibroids. TREATMENT Treatment may include:  Watchful waiting. This involves getting the fibroid checked by your health care provider to see if it grows or shrinks. Follow your health care provider's recommendations for how often to have this checked.  Hormone medicines. These can be taken by mouth or given through an intrauterine device (IUD).  Surgery.  Removing the fibroids (myomectomy) or the uterus (hysterectomy).  Removing blood supply to the fibroids (uterine artery embolization). If fibroids interfere with your fertility and you want to become pregnant, your health care provider  may recommend having the fibroids removed.  HOME CARE INSTRUCTIONS  Keep all follow-up visits as directed by your health care provider. This is important.  Take medicines only as directed by your health care provider.  If you were prescribed a hormone treatment, take the hormone medicines exactly as directed.  Do not take aspirin, because it can cause bleeding.  Ask your health care provider about taking iron pills and increasing the amount of dark green, leafy vegetables in your diet. These actions can help to boost your blood iron levels, which may be affected by heavy menstrual bleeding.  Pay close attention to your period and tell your health care provider about any changes, such as:  Increased blood flow that requires you to use more pads or tampons than usual per month.  A change in the number of days that your period lasts per month.  A change in symptoms that are associated with your period, such as abdominal cramping or back pain. SEEK MEDICAL CARE IF:  You have pelvic pain, back pain, or abdominal cramps that cannot be controlled with medicines.  You have an increase in bleeding between and during periods.  You soak tampons or pads in a half hour or less.  You feel lightheaded, extra tired, or weak. SEEK IMMEDIATE MEDICAL CARE IF:  You faint.  You have a sudden increase in pelvic pain.   This information is not intended to replace advice given to you by your health care provider. Make sure you discuss any questions you have with your health care provider.   Document Released: 05/16/2000 Document Revised: 06/09/2014 Document Reviewed: 11/15/2013 Elsevier Interactive Patient Education 2016 Elsevier Inc.  

## 2016-02-06 NOTE — Assessment & Plan Note (Signed)
After careful consideration of options, patient elected for IUD placement. Other options including uterine fibroid embolization, HTA, continued attempts at oral meds discussed. Patient works 2 jobs and would not be able to take time off from work. 3 prior C-sections but a candidate for TVH. Limitations and failure of other options discussed.

## 2016-02-07 ENCOUNTER — Telehealth: Payer: Self-pay | Admitting: *Deleted

## 2016-02-07 ENCOUNTER — Telehealth: Payer: Self-pay | Admitting: General Practice

## 2016-02-07 DIAGNOSIS — R102 Pelvic and perineal pain: Secondary | ICD-10-CM

## 2016-02-07 LAB — CYTOLOGY - PAP

## 2016-02-07 LAB — TSH: TSH: 1.47 m[IU]/L

## 2016-02-07 NOTE — Telephone Encounter (Signed)
Per Dr Kennon Rounds, patient is still anemic and needs to continue iron. Called patient, no answer- left message stating to call us back for non urgent results.

## 2016-02-07 NOTE — Telephone Encounter (Signed)
t notified of labwork from yesterday.  She is to continue taking her her for anemia

## 2016-02-07 NOTE — Telephone Encounter (Signed)
-----   Message from Donnamae Jude, MD sent at 02/07/2016  8:29 AM EDT ----- She remains anemic. Instruct her to continue her iron. Her thyroid is normal.

## 2016-02-08 MED ORDER — IBUPROFEN 600 MG PO TABS: 600.0000 mg | ORAL_TABLET | Freq: Four times a day (QID) | ORAL | 1 refills | Status: DC | PRN

## 2016-02-08 NOTE — Telephone Encounter (Signed)
Received a call from [place] returning our call.  I informed her per Dr. Kennon Rounds that she is still anemic- 8.2 and should continue taking her iron as instructed. We discussed she is taking her iron TID with meals. She states she is having issues with constipation- I advised her to take over the counter colace once a day , or twice a day as needed. If this is not sufficient- give [place] a call back. She also requested ibuprofen prescription strength for pelvic pain not relieved by tylenol.  I informed her I would get order approved from provider and send to pharmacy today. She voices understanding.

## 2016-02-25 ENCOUNTER — Encounter: Payer: Self-pay | Admitting: Neurology

## 2016-02-25 ENCOUNTER — Ambulatory Visit (INDEPENDENT_AMBULATORY_CARE_PROVIDER_SITE_OTHER): Payer: BLUE CROSS/BLUE SHIELD | Admitting: Neurology

## 2016-02-25 DIAGNOSIS — R519 Headache, unspecified: Secondary | ICD-10-CM | POA: Insufficient documentation

## 2016-02-25 DIAGNOSIS — H539 Unspecified visual disturbance: Secondary | ICD-10-CM | POA: Insufficient documentation

## 2016-02-25 DIAGNOSIS — R51 Headache: Secondary | ICD-10-CM | POA: Diagnosis not present

## 2016-02-25 NOTE — Progress Notes (Signed)
PATIENT: Jean Ferguson DOB: 1976-05-17  Chief Complaint  Patient presents with  . Optic Nerve Swelling    Says her vision has been worsening over the last six months.  She has a blind spot in her left eye and blurred vision bilaterally.  She had a recent eye exam that revealed swelling of her optic nerve and was referred for further evaluation.     HISTORICAL  Jean Ferguson is a 40 years old right-handed female, seen in refer by her optometrist Dr. Celestia Khat for evaluation of ocular symptoms on September 25th 2017  She had a history of fibroid, with heavy prolonged bleeding, was recently started on Megace 40 mg 3 times a day,  I reviewed and summarized her most recent optometry exam on December 14 2015, visual field was full, there was swallowing appearance of right optic nerve, hazy borders, left eye appears to be normal, there was a concern of increased intracranial pressure neurology referral for further evaluation,   Patient is easily frustrated because of her visual problem, this started since March 2017, she noticed a black spot in his left eye only involving left lower visual field, it has a black hole, gradually getting worse, bigger, persistent, it is now to the point of affecting her driving, she has to turn her head around to get a better view sometimes,  She also reported increased headaches since July, she also had a prolonged period for 2 months,   REVIEW OF SYSTEMS: Full 14 system review of systems performed and notable only for insomnia, headache, not enough sleep, decreased energy, change in appetite, weight loss, ringing ears, blurred vision, loss of vision, but in stools, constipation, anemia, easy bruising, feeling cold, increased thirst, achy muscles, headaches, ALLERGIES: Allergies  Allergen Reactions  . Morphine And Related     Pt states she feels like she is on fire inside    HOME MEDICATIONS: Current Outpatient Prescriptions  Medication Sig Dispense  Refill  . acetaminophen (TYLENOL) 500 MG tablet Take 500 mg by mouth every 6 (six) hours as needed for moderate pain.    Marland Kitchen ibuprofen (ADVIL,MOTRIN) 600 MG tablet Take 1 tablet (600 mg total) by mouth every 6 (six) hours as needed. 30 tablet 1  . megestrol (MEGACE) 40 MG tablet Take 1 tablet (40 mg total) by mouth daily. 60 tablet 5   No current facility-administered medications for this visit.     PAST MEDICAL HISTORY: Past Medical History:  Diagnosis Date  . Fibroids   . Optic nerve swelling     PAST SURGICAL HISTORY: Past Surgical History:  Procedure Laterality Date  . CESAREAN SECTION     x 3  . TUBAL LIGATION      FAMILY HISTORY: Family History  Problem Relation Age of Onset  . Diabetes Mother   . Lung cancer Father   . Diabetes Father   . Heart attack Paternal Grandfather   . Stroke Paternal Uncle   . Diabetes Paternal Uncle   . Heart attack Paternal Uncle     x 3 brothers  . Stroke Paternal Aunt   . Diabetes Paternal Aunt   . Heart attack Paternal Aunt     SOCIAL HISTORY:  Social History   Social History  . Marital status: Single    Spouse name: N/A  . Number of children: 3  . Years of education: 10th   Occupational History  . Floor Tech    Social History Main Topics  . Smoking status:  Current Every Day Smoker    Packs/day: 0.25    Types: Cigarettes  . Smokeless tobacco: Never Used  . Alcohol use Yes     Comment: social  . Drug use: No  . Sexual activity: Yes    Birth control/ protection: Surgical   Other Topics Concern  . Not on file   Social History Narrative   Right-handed.   Lives at home with children.   Occasional caffeine use.     PHYSICAL EXAM   Vitals:   02/25/16 0902  BP: 126/86  Pulse: 76  Weight: 149 lb 8 oz (67.8 kg)  Height: 5\' 3"  (1.6 m)    Not recorded      Body mass index is 26.48 kg/m.  PHYSICAL EXAMNIATION:  Gen: NAD, conversant, well nourised, obese, well groomed                     Cardiovascular:  Regular rate rhythm, no peripheral edema, warm, nontender. Eyes: Conjunctivae clear without exudates or hemorrhage Neck: Supple, no carotid bruise. Pulmonary: Clear to auscultation bilaterally   NEUROLOGICAL EXAM:  MENTAL STATUS: Speech:    Speech is normal; fluent and spontaneous with normal comprehension.  Cognition:     Orientation to time, place and person     Normal recent and remote memory     Normal Attention span and concentration     Normal Language, naming, repeating,spontaneous speech     Fund of knowledge   CRANIAL NERVES: CN II: Fundoscopic exam is normal with sharp discs and no vascular changes. Pupils are round equal and briskly reactive to light.  She has decreased visual field on left confrontational test, CN III, IV, VI: extraocular movement are normal. No ptosis. CN V: Facial sensation is intact to pinprick in all 3 divisions bilaterally. Corneal responses are intact.  CN VII: Face is symmetric with normal eye closure and smile. CN VIII: Hearing is normal to rubbing fingers CN IX, X: Palate elevates symmetrically. Phonation is normal. CN XI: Head turning and shoulder shrug are intact CN XII: Tongue is midline with normal movements and no atrophy.  MOTOR: There is no pronator drift of out-stretched arms. Muscle bulk and tone are normal. Muscle strength is normal.  REFLEXES: Reflexes are 2+ and symmetric at the biceps, triceps, knees, and ankles. Plantar responses are flexor.  SENSORY: Intact to light touch, pinprick, positional sensation and vibratory sensation are intact in fingers and toes.  COORDINATION: Rapid alternating movements and fine finger movements are intact. There is no dysmetria on finger-to-nose and heel-knee-shin.    GAIT/STANCE: Posture is normal. Gait is steady with normal steps, base, arm swing, and turning. Heel and toe walking are normal. Tandem gait is normal.  Romberg is absent.   DIAGNOSTIC DATA (LABS, IMAGING, TESTING) - I  reviewed patient records, labs, notes, testing and imaging myself where available.   ASSESSMENT AND PLAN  Jean Ferguson is a 40 y.o. 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 Ph.D.  [facility] [address] [place], [place] [address] Ph: [phone] Fax: [phone]  CC: Celestia Khat, [place], her primary care physician is Seabron Spates, CNM

## 2016-02-26 ENCOUNTER — Encounter: Payer: Self-pay | Admitting: Family Medicine

## 2016-02-26 ENCOUNTER — Ambulatory Visit (INDEPENDENT_AMBULATORY_CARE_PROVIDER_SITE_OTHER): Payer: BLUE CROSS/BLUE SHIELD | Admitting: Family Medicine

## 2016-02-26 VITALS — BP 125/71 | HR 86 | Temp 99.0°F | Resp 16 | Ht 63.0 in | Wt 151.0 lb

## 2016-02-26 DIAGNOSIS — Z131 Encounter for screening for diabetes mellitus: Secondary | ICD-10-CM | POA: Diagnosis not present

## 2016-02-26 DIAGNOSIS — Z13220 Encounter for screening for lipoid disorders: Secondary | ICD-10-CM | POA: Diagnosis not present

## 2016-02-26 NOTE — Progress Notes (Signed)
Jean Ferguson, is a 40 y.o. female  [id number]  [id number]  DOB - 1975-11-11  CC:  Chief Complaint  Patient presents with  . Establish Care  . Headache  . Dizziness       HPI: Jean Ferguson is a 40 y.o. female here for follow-up. She has a history of headache and sees a neurologist, fibroids with excessive bleeding with anemia and is followed by her GYN. We do not manage any of her medictions here. She offer no specific complaint today. She reports a low salt, low carb diet. She does not get any exercise except a work. She smokes 5-6 cigarettes a day, denies alcohol or drug use.   Health Maintenance: She declines Tdap and influenza vaccines today. She is in need of a mammogram. PAP is UTD.   Allergies  Allergen Reactions  . Morphine And Related     Pt states she feels like she is on fire inside   Past Medical History:  Diagnosis Date  . Fibroids   . Optic nerve swelling    Current Outpatient Prescriptions on File Prior to Visit  Medication Sig Dispense Refill  . megestrol (MEGACE) 40 MG tablet Take 1 tablet (40 mg total) by mouth daily. (Patient taking differently: Take 120 mg by mouth daily. ) 60 tablet 5  . acetaminophen (TYLENOL) 500 MG tablet Take 500 mg by mouth every 6 (six) hours as needed for moderate pain.    Marland Kitchen ibuprofen (ADVIL,MOTRIN) 600 MG tablet Take 1 tablet (600 mg total) by mouth every 6 (six) hours as needed. (Patient not taking: Reported on 02/26/2016) 30 tablet 1   No current facility-administered medications on file prior to visit.    Family History  Problem Relation Age of Onset  . Diabetes Mother   . Lung cancer Father   . Diabetes Father   . Heart attack Paternal Grandfather   . Stroke Paternal Uncle   . Diabetes Paternal Uncle   . Heart attack Paternal Uncle     x 3 brothers  . Stroke Paternal Aunt   . Diabetes Paternal Aunt   . Heart attack Paternal Aunt    Social History   Social History  . Marital status: Single    Spouse  name: N/A  . Number of children: 3  . Years of education: 10th   Occupational History  . Floor Tech    Social History Main Topics  . Smoking status: Current Every Day Smoker    Packs/day: 0.25    Types: Cigarettes  . Smokeless tobacco: Never Used  . Alcohol use Yes     Comment: social  . Drug use: No  . Sexual activity: Yes    Birth control/ protection: Surgical   Other Topics Concern  . Not on file   Social History Narrative   Right-handed.   Lives at home with children.   Occasional caffeine use.    Review of Systems: Constitutional: Positive for fatigue Skin: Negative HENT: Negative  Eyes: Positive fo blind spot in left eye (neurologist) Neck: Negative Respiratory: Negative Cardiovascular: Positive for occ palpitations Gastrointestinal: Negative Genitourinary: Positive for lower abd pain related to fibroids Musculoskeletal: Positive for low back pain Neurological: Positive for headaches Hematological: Negative  Psychiatric/Behavioral: Negative    Objective:   Vitals:   02/26/16 0922  BP: 125/71  Pulse: 86  Resp: 16  Temp: 99 F (37.2 C)    Physical Exam: Constitutional: Patient appears well-developed and well-nourished. No distress. HENT: Normocephalic, atraumatic, External right and  left ear normal. Oropharynx is clear and moist.  Eyes: Conjunctivae and EOM are normal. PERRLA, no scleral icterus. Neck: Normal ROM. Neck supple. No lymphadenopathy, No thyromegaly. CVS: RRR, S1/S2 +, no murmurs, no gallops, no rubs Pulmonary: Effort and breath sounds normal, no stridor, rhonchi, wheezes, rales.  Abdominal: Soft. Normoactive BS,, no distension, tenderness, rebound or guarding.  Musculoskeletal: Normal range of motion. No edema and no tenderness.  Neuro: Alert.Normal muscle tone coordination. Non-focal Skin: Skin is warm and dry. No rash noted. Not diaphoretic. No erythema. No pallor.Good capillary refill Psychiatric: Normal mood and affect. Behavior,  judgment, thought content normal.  Lab Results  Component Value Date   WBC 7.8 02/06/2016   HGB 8.2 (L) 02/06/2016   HCT 27.7 (L) 02/06/2016   MCV 71.8 (L) 02/06/2016   PLT 487 (H) 02/06/2016   Lab Results  Component Value Date   CREATININE 0.83 01/05/2016   BUN 13 01/05/2016   NA 136 01/05/2016   K 3.8 01/05/2016   CL 105 01/05/2016   CO2 25 01/05/2016    No results found for: HGBA1C Lipid Panel  No results found for: CHOL, TRIG, HDL, CHOLHDL, VLDL, LDLCALC     Assessment and plan:   1. Screening cholesterol level  - Lipid panel; Future  2. Screening for diabetes mellitus  - Hemoglobin A1c; Future   Return in about 6 months (around 08/25/2016).  The patient was given clear instructions to go to ER or return to medical center if symptoms don't improve, worsen or new problems develop. The patient verbalized understanding.    Micheline Chapman FNP  02/26/2016, 11:02 AM

## 2016-02-26 NOTE — Patient Instructions (Addendum)
Call your GYN and see if you need to be on iron supplement. Bring your BP cuff at next visit Watch salt, fat and carbs Try to make a decision to stop smoking Call you GYN to see if you need iron supplement.

## 2016-02-28 ENCOUNTER — Encounter (HOSPITAL_COMMUNITY): Payer: Self-pay | Admitting: Emergency Medicine

## 2016-02-28 ENCOUNTER — Emergency Department (HOSPITAL_COMMUNITY)
Admission: EM | Admit: 2016-02-28 | Discharge: 2016-02-28 | Disposition: A | Discharge status: Home / Self Care | Payer: BLUE CROSS/BLUE SHIELD | Attending: Dermatology | Admitting: Dermatology

## 2016-02-28 DIAGNOSIS — F17210 Nicotine dependence, cigarettes, uncomplicated: Secondary | ICD-10-CM | POA: Diagnosis not present

## 2016-02-28 DIAGNOSIS — R51 Headache: Secondary | ICD-10-CM | POA: Insufficient documentation

## 2016-02-28 DIAGNOSIS — Z5321 Procedure and treatment not carried out due to patient leaving prior to being seen by health care provider: Secondary | ICD-10-CM | POA: Insufficient documentation

## 2016-02-28 NOTE — ED Notes (Signed)
Called patient 3x, no answer

## 2016-02-28 NOTE — ED Notes (Signed)
nop answer

## 2016-02-28 NOTE — ED Triage Notes (Signed)
Pt reports headache that began 3-4 hours ago. States for the 2 days has been dealing with cold symptoms and congestion. Denies recent fever. Pt reports some blurred vision with the headache. Pt states hx of headaches. Currently alert, oriented x4, ambulatory, MAE. Stroke scale neg.

## 2016-02-29 ENCOUNTER — Other Ambulatory Visit: Payer: BLUE CROSS/BLUE SHIELD

## 2016-02-29 DIAGNOSIS — Z131 Encounter for screening for diabetes mellitus: Secondary | ICD-10-CM

## 2016-02-29 DIAGNOSIS — Z13220 Encounter for screening for lipoid disorders: Secondary | ICD-10-CM

## 2016-02-29 LAB — LIPID PANEL
CHOLESTEROL: 108 mg/dL — AB (ref 125–200)
HDL: 36 mg/dL — ABNORMAL LOW (ref >=46)
LDL Cholesterol: 63 mg/dL (ref <130)
Total CHOL/HDL Ratio: 3 Ratio (ref <=5.0)
Triglycerides: 44 mg/dL (ref <150)
VLDL: 9 mg/dL (ref <30)

## 2016-03-01 LAB — HEMOGLOBIN A1C
HEMOGLOBIN A1C: 5.6 % (ref <5.7)
MEAN PLASMA GLUCOSE: 114 mg/dL

## 2016-03-17 ENCOUNTER — Encounter: Payer: Self-pay | Admitting: Obstetrics & Gynecology

## 2016-03-17 ENCOUNTER — Ambulatory Visit (INDEPENDENT_AMBULATORY_CARE_PROVIDER_SITE_OTHER): Payer: BLUE CROSS/BLUE SHIELD | Admitting: Obstetrics & Gynecology

## 2016-03-17 VITALS — BP 123/86 | HR 85 | Wt 154.0 lb

## 2016-03-17 DIAGNOSIS — Z30432 Encounter for removal of intrauterine contraceptive device: Secondary | ICD-10-CM | POA: Diagnosis not present

## 2016-03-17 DIAGNOSIS — T8332XA Displacement of intrauterine contraceptive device, initial encounter: Secondary | ICD-10-CM

## 2016-03-17 MED ORDER — MISOPROSTOL 200 MCG PO TABS: ORAL_TABLET | ORAL | 0 refills | Status: DC

## 2016-03-17 MED ORDER — DOCUSATE SODIUM 100 MG PO CAPS: 100.0000 mg | ORAL_CAPSULE | Freq: Two times a day (BID) | ORAL | 2 refills | Status: DC | PRN

## 2016-03-17 MED ORDER — FERROUS SULFATE 325 (65 FE) MG PO TABS: 325.0000 mg | ORAL_TABLET | Freq: Two times a day (BID) | ORAL | 1 refills | Status: DC

## 2016-03-17 NOTE — Progress Notes (Signed)
   Subjective:    Patient ID: Jean Ferguson, female    DOB: March 13, 1976, 40 y.o.   MRN: [id number]  HPI  Pt presents for IUD removal after feeling strings coming out of vagina.  She is having discomfort from them. She is also still bleeding with Mirena and Megace.  She is not taking iron.  She thought the Megace was a form of iron.    Review of Systems  Constitutional: Negative.   Gastrointestinal: Negative.   Genitourinary: Positive for menstrual problem, vaginal bleeding and vaginal pain.       Objective:   Physical Exam  Constitutional: She is oriented to person, place, and time. She appears well-developed and well-nourished. No distress.  HENT:  Head: Normocephalic and atraumatic.  Eyes: Conjunctivae are normal.  Pulmonary/Chest: Effort normal.  Abdominal: Soft. Bowel sounds are normal. She exhibits no distension. There is no tenderness.  Genitourinary:  Genitourinary Comments: Vulva:  No lesion Vagina:  Moderate amt of blood in vault, cleared away. Cervix:  Strings 7+ cm extruding from os   Musculoskeletal: She exhibits no edema.  Neurological: She is alert and oriented to person, place, and time.  Skin: Skin is warm and dry.  Psychiatric: She has a normal mood and affect.  Vitals reviewed.  Assessment & Plan:  40 yo female with IUD partial explusion.  The device and string length together was abnormally long--14+ cm.  ? Not placed correctly as there have been insertional issues with this device.  Pt was not aware that a Lilleta had been placed.  She thought a Mirena had been placed.  She is interested in having a Mirena placed.  Theses are only stocked at the [place] office now.  Pt is scheduled for Mirena placement next week.  Cytotec given prior to procedure.  Pt is tense with speculum exams.    Continue Megace.  Start iron and colace.  IUD Removal Patient identified, informed consent performed.  Time out was performed. Speculum placed in the vagina. Cervix  visualized. The extraordinarily long strings of the IUD were grasped and pulled using ring forceps. The IUD was successfully removed in its entirety.   Pt had no adverse sequelae.

## 2016-03-25 ENCOUNTER — Ambulatory Visit: Payer: BLUE CROSS/BLUE SHIELD | Admitting: Obstetrics & Gynecology

## 2016-03-31 ENCOUNTER — Encounter: Payer: Self-pay | Admitting: Neurology

## 2016-03-31 ENCOUNTER — Ambulatory Visit (INDEPENDENT_AMBULATORY_CARE_PROVIDER_SITE_OTHER): Payer: BLUE CROSS/BLUE SHIELD | Admitting: Neurology

## 2016-03-31 VITALS — BP 122/84 | HR 77 | Ht 63.0 in | Wt 154.0 lb

## 2016-03-31 DIAGNOSIS — H539 Unspecified visual disturbance: Secondary | ICD-10-CM

## 2016-03-31 NOTE — Progress Notes (Signed)
PATIENT: Jean Ferguson DOB: Oct 21, 1975  Chief Complaint  Patient presents with  . Visual Disturbance    Her symptoms are unchanged. She still has a blind spot in her left eye.  She has a MRI pending on 04/08/16.     HISTORICAL  Jean Ferguson is a 40 years old right-handed female, seen in refer by her optometrist Dr. Celestia Khat for evaluation of ocular symptoms on September 25th 2017  She had a history of fibroid, with heavy prolonged bleeding, was recently started on Megace 40 mg 3 times a day,  I reviewed and summarized her most recent optometry exam on December 14 2015, visual field was full, there was swallowing appearance of right optic nerve, hazy borders, left eye appears to be normal, there was a concern of increased intracranial pressure neurology referral for further evaluation,   Patient is easily frustrated because of her visual problem, this started since March 2017, she noticed a black spot in his left eye only involving left lower visual field, it has a black hole, gradually getting worse, bigger, persistent, it is now to the point of affecting her driving, she has to turn her head around to get a better view sometimes,  She also reported increased headaches since July, she also had a prolonged period for 2 months,  UPDATE Mar 31 2016: She continue complains of right-sided blurry vision, left lower quadrant enlarged blind spot Laboratory evaluation in September 2017, normal LDL 63, A1c 5.6, CBC showed anemia with hemoglobin of 8.2,normal TSH  REVIEW OF SYSTEMS: Full 14 system review of systems performed and notable only for as above. ALLERGIES: Allergies  Allergen Reactions  . Morphine And Related     Pt states she feels like she is on fire inside    HOME MEDICATIONS: Current Outpatient Prescriptions  Medication Sig Dispense Refill  . acetaminophen (TYLENOL) 500 MG tablet Take 500 mg by mouth every 6 (six) hours as needed for moderate pain.    Marland Kitchen docusate  sodium (COLACE) 100 MG capsule Take 1 capsule (100 mg total) by mouth 2 (two) times daily as needed. 30 capsule 2  . ferrous sulfate (FERROUSUL) 325 (65 FE) MG tablet Take 1 tablet (325 mg total) by mouth 2 (two) times daily. 60 tablet 1  . ibuprofen (ADVIL,MOTRIN) 600 MG tablet Take 1 tablet (600 mg total) by mouth every 6 (six) hours as needed. 30 tablet 1  . megestrol (MEGACE) 40 MG tablet Take 1 tablet (40 mg total) by mouth daily. (Patient taking differently: Take 120 mg by mouth daily. ) 60 tablet 5  . misoprostol (CYTOTEC) 200 MCG tablet Place two tablets in the vagina the morning of your next clinic appointment 2 tablet 0   No current facility-administered medications for this visit.     PAST MEDICAL HISTORY: Past Medical History:  Diagnosis Date  . Fibroids   . Optic nerve swelling     PAST SURGICAL HISTORY: Past Surgical History:  Procedure Laterality Date  . CESAREAN SECTION     x 3  . TUBAL LIGATION      FAMILY HISTORY: Family History  Problem Relation Age of Onset  . Diabetes Mother   . Lung cancer Father   . Diabetes Father   . Heart attack Paternal Grandfather   . Stroke Paternal Uncle   . Diabetes Paternal Uncle   . Heart attack Paternal Uncle     x 3 brothers  . Stroke Paternal Aunt   . Diabetes Paternal  Aunt   . Heart attack Paternal Aunt     SOCIAL HISTORY:  Social History   Social History  . Marital status: Single    Spouse name: N/A  . Number of children: 3  . Years of education: 10th   Occupational History  . Floor Tech    Social History Main Topics  . Smoking status: Current Every Day Smoker    Packs/day: 0.25    Types: Cigarettes  . Smokeless tobacco: Never Used  . Alcohol use Yes     Comment: social  . Drug use: No  . Sexual activity: Yes    Birth control/ protection: Surgical   Other Topics Concern  . Not on file   Social History Narrative   Right-handed.   Lives at home with children.   Occasional caffeine use.      PHYSICAL EXAM   Vitals:   03/31/16 0829  BP: 122/84  Pulse: 77  Weight: 154 lb (69.9 kg)  Height: 5\' 3"  (1.6 m)    Not recorded      Body mass index is 27.28 kg/m.  PHYSICAL EXAMNIATION:  Gen: NAD, conversant, well nourised, obese, well groomed                     Cardiovascular: Regular rate rhythm, no peripheral edema, warm, nontender. Eyes: Conjunctivae clear without exudates or hemorrhage Neck: Supple, no carotid bruise. Pulmonary: Clear to auscultation bilaterally   NEUROLOGICAL EXAM:  MENTAL STATUS: Speech:    Speech is normal; fluent and spontaneous with normal comprehension.  Cognition:     Orientation to time, place and person     Normal recent and remote memory     Normal Attention span and concentration     Normal Language, naming, repeating,spontaneous speech     Fund of knowledge   CRANIAL NERVES: CN II: Fundoscopic exam is normal with sharp discs and no vascular changes. Pupils are round equal and briskly reactive to light.  She has decreased visual field on left confrontational test, CN III, IV, VI: extraocular movement are normal. No ptosis. CN V: Facial sensation is intact to pinprick in all 3 divisions bilaterally. Corneal responses are intact.  CN VII: Face is symmetric with normal eye closure and smile. CN VIII: Hearing is normal to rubbing fingers CN IX, X: Palate elevates symmetrically. Phonation is normal. CN XI: Head turning and shoulder shrug are intact CN XII: Tongue is midline with normal movements and no atrophy.  MOTOR: There is no pronator drift of out-stretched arms. Muscle bulk and tone are normal. Muscle strength is normal.  REFLEXES: Reflexes are 2+ and symmetric at the biceps, triceps, knees, and ankles. Plantar responses are flexor.  SENSORY: Intact to light touch, pinprick, positional sensation and vibratory sensation are intact in fingers and toes.  COORDINATION: Rapid alternating movements and fine finger movements  are intact. There is no dysmetria on finger-to-nose and heel-knee-shin.    GAIT/STANCE: Posture is normal. Gait is steady with normal steps, base, arm swing, and turning. Heel and toe walking are normal. Tandem gait is normal.  Romberg is absent.   DIAGNOSTIC DATA (LABS, IMAGING, TESTING) - I reviewed patient records, labs, notes, testing and imaging myself where available.   ASSESSMENT AND PLAN  Jean Ferguson is a 40 y.o. 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 Ph.D.  [facility] [address] [place], [place] [address] Ph: [phone] Fax: [phone]  CC: Celestia Khat, [place], her primary care physician is Seabron Spates, CNM

## 2016-04-01 LAB — FOLATE: Folate: 9.1 ng/mL (ref >3.0)

## 2016-04-01 LAB — C-REACTIVE PROTEIN: CRP: 1.1 mg/L (ref 0.0–4.9)

## 2016-04-01 LAB — SEDIMENTATION RATE: SED RATE: 51 mm/h — AB (ref 0–32)

## 2016-04-01 LAB — ANA W/REFLEX IF POSITIVE: ANA: NEGATIVE

## 2016-04-01 LAB — VITAMIN B12: VITAMIN B 12: 551 pg/mL (ref 211–946)

## 2016-04-01 LAB — RPR: RPR: NONREACTIVE

## 2016-04-01 LAB — HIV ANTIBODY (ROUTINE TESTING W REFLEX): HIV Screen 4th Generation wRfx: NONREACTIVE

## 2016-04-01 LAB — VITAMIN D 25 HYDROXY (VIT D DEFICIENCY, FRACTURES): VIT D 25 HYDROXY: 16.3 ng/mL — AB (ref 30.0–100.0)

## 2016-04-01 LAB — ANGIOTENSIN CONVERTING ENZYME: ANGIO CONVERT ENZYME: 47 U/L (ref 14–82)

## 2016-04-02 ENCOUNTER — Encounter: Payer: Self-pay | Admitting: *Deleted

## 2016-04-02 ENCOUNTER — Telehealth: Payer: Self-pay | Admitting: Neurology

## 2016-04-02 NOTE — Telephone Encounter (Signed)
Spoke to patient - she is aware of results and will start the recommended daily supplement of Vitamin D.

## 2016-04-02 NOTE — Telephone Encounter (Signed)
Please call patient, vitamin D level was low 16, she should take over-the-counter vitamin D supplement 1000 units daily,  There was elevated ESR, this is most likely related to her anemia, C-reactive protein is normal, no evidence to support a systemic inflammatory process.

## 2016-04-08 ENCOUNTER — Other Ambulatory Visit: Payer: Self-pay | Admitting: *Deleted

## 2016-04-08 ENCOUNTER — Telehealth: Payer: Self-pay | Admitting: Neurology

## 2016-04-08 ENCOUNTER — Other Ambulatory Visit: Payer: BLUE CROSS/BLUE SHIELD

## 2016-04-08 ENCOUNTER — Telehealth: Payer: Self-pay | Admitting: *Deleted

## 2016-04-08 ENCOUNTER — Ambulatory Visit
Admission: RE | Admit: 2016-04-08 | Discharge: 2016-04-08 | Disposition: A | Discharge status: Home / Self Care | Payer: BLUE CROSS/BLUE SHIELD | Source: Ambulatory Visit | Attending: Neurology | Admitting: Neurology

## 2016-04-08 DIAGNOSIS — R51 Headache: Secondary | ICD-10-CM

## 2016-04-08 DIAGNOSIS — H539 Unspecified visual disturbance: Secondary | ICD-10-CM

## 2016-04-08 DIAGNOSIS — R519 Headache, unspecified: Secondary | ICD-10-CM

## 2016-04-08 MED ORDER — ALPRAZOLAM 0.5 MG PO TABS: ORAL_TABLET | ORAL | 0 refills | Status: DC

## 2016-04-08 NOTE — Telephone Encounter (Signed)
Pt called in stating she was unable to complete her MRI today because she become to nervous and claustrophobic. She wants to know if she can have something to help her relax for the next one. Please call and advise [phone]

## 2016-04-08 NOTE — Telephone Encounter (Signed)
Pt left message stating that she had been scheduled for Mirena IUD insertion at the [place] office however she cancelled the appointment because her bleeding has improved from taking the pills (Megace). She wants to know how long she will need to take the pills and when she can get an appointment to have hysterectomy. I consulted with Dr. Ihor Dow and then called pt. I confirmed that her bleeding is controlled and she is still taking Megace. Pt stated that Dr. Gala Romney had told her that because of her fibroids "eventually she would need a hysterectomy". I advised pt that surgery is one plan of care however there are other options as well.  She will need to have appointment to discuss plan of care options.  Pt voiced understanding and stated that a message can be left on her voice mail if she does not answer as she works evening shift.

## 2016-04-08 NOTE — Telephone Encounter (Signed)
Allergies verified.  Ok, per Dr. Krista Blue, to provide Xanax per MRI protocol.  Rx faxed and confirmed to [facility].

## 2016-04-21 ENCOUNTER — Ambulatory Visit
Admission: RE | Admit: 2016-04-21 | Discharge: 2016-04-21 | Disposition: A | Discharge status: Home / Self Care | Payer: Medicaid Other | Source: Ambulatory Visit | Attending: Neurology | Admitting: Neurology

## 2016-04-21 DIAGNOSIS — R51 Headache: Secondary | ICD-10-CM | POA: Diagnosis not present

## 2016-04-21 DIAGNOSIS — H539 Unspecified visual disturbance: Secondary | ICD-10-CM | POA: Diagnosis not present

## 2016-04-21 MED ORDER — GADOBENATE DIMEGLUMINE 529 MG/ML IV SOLN
14.0000 mL | Freq: Once | INTRAVENOUS | Status: AC | PRN
  Administered 2016-04-21: 14 mL via INTRAVENOUS

## 2016-05-29 ENCOUNTER — Encounter: Payer: Medicaid Other | Admitting: Family Medicine

## 2016-05-29 ENCOUNTER — Encounter: Payer: Self-pay | Admitting: General Practice

## 2016-06-09 ENCOUNTER — Encounter: Payer: Self-pay | Admitting: Neurology

## 2016-06-09 ENCOUNTER — Ambulatory Visit (INDEPENDENT_AMBULATORY_CARE_PROVIDER_SITE_OTHER): Payer: Self-pay | Admitting: Neurology

## 2016-06-09 DIAGNOSIS — H4710 Unspecified papilledema: Secondary | ICD-10-CM | POA: Insufficient documentation

## 2016-06-09 NOTE — Progress Notes (Signed)
PATIENT: Jean Ferguson DOB: 30-Dec-1975  Chief Complaint  Patient presents with  . Visual disturbance    Reports only a mild improvement in vision.  She would like to review her MRI.     HISTORICAL  Jean Ferguson is a 41 years old right-handed female, seen in refer by her optometrist Dr. Celestia Khat for evaluation of ocular symptoms on September 25th 2017  She had a history of fibroid, with heavy prolonged bleeding, was recently started on Megace 40 mg 3 times a day,  I reviewed and summarized her most recent optometry exam on December 14 2015, visual field was full, there was swallowing appearance of right optic nerve, hazy borders, left eye appears to be normal, there was a concern of increased intracranial pressure neurology referral for further evaluation,   Patient is easily frustrated because of her visual problem, this started since March 2017, she noticed a black spot in his left eye only involving left lower visual field, it has a black hole, gradually getting worse, bigger, persistent, it is now to the point of affecting her driving, she has to turn her head around to get a better view sometimes,  She also reported increased headaches since July, she also had a prolonged period for 2 months,  UPDATE Mar 31 2016: She continue complains of leftt-sided blurry vision, left lower quadrant enlarged blind spot Laboratory evaluation in September 2017, normal LDL 63, A1c 5.6, CBC showed anemia with hemoglobin of 8.2,normal TSH  UPDATE Jan 8th 2018:  She has mild improvement of her left side blurry vision she still has left lower temporal filed deficit, but much better,  We have personally reviewed MRI of the brain on April 21 2016 with without contrast, mild supratentorium small vessel disease, no acute abnormalities.  Laboratory evaluation angiotension converting enzyme 47, normal V37 106, folic acid, C reactive protein, negative RPR, HIV, mild elevated ESR 51, low vitamin  D 16,  REVIEW OF SYSTEMS: Full 14 system review of systems performed and notable only for as above. ALLERGIES: Allergies  Allergen Reactions  . Morphine And Related     Pt states she feels like she is on fire inside    HOME MEDICATIONS: Current Outpatient Prescriptions  Medication Sig Dispense Refill  . acetaminophen (TYLENOL) 500 MG tablet Take 500 mg by mouth every 6 (six) hours as needed for moderate pain.    . cholecalciferol (VITAMIN D) 1000 units tablet Take 1,000 Units by mouth daily.    Marland Kitchen docusate sodium (COLACE) 100 MG capsule Take 1 capsule (100 mg total) by mouth 2 (two) times daily as needed. 30 capsule 2  . ferrous sulfate (FERROUSUL) 325 (65 FE) MG tablet Take 1 tablet (325 mg total) by mouth 2 (two) times daily. 60 tablet 1  . ibuprofen (ADVIL,MOTRIN) 600 MG tablet Take 1 tablet (600 mg total) by mouth every 6 (six) hours as needed. 30 tablet 1  . megestrol (MEGACE) 40 MG tablet Take 1 tablet (40 mg total) by mouth daily. (Patient taking differently: Take 120 mg by mouth daily. ) 60 tablet 5  . misoprostol (CYTOTEC) 200 MCG tablet Place two tablets in the vagina the morning of your next clinic appointment 2 tablet 0   No current facility-administered medications for this visit.     PAST MEDICAL HISTORY: Past Medical History:  Diagnosis Date  . Fibroids   . Optic nerve swelling     PAST SURGICAL HISTORY: Past Surgical History:  Procedure Laterality Date  .  CESAREAN SECTION     x 3  . TUBAL LIGATION      FAMILY HISTORY: Family History  Problem Relation Age of Onset  . Diabetes Mother   . Lung cancer Father   . Diabetes Father   . Heart attack Paternal Grandfather   . Stroke Paternal Uncle   . Diabetes Paternal Uncle   . Heart attack Paternal Uncle     x 3 brothers  . Stroke Paternal Aunt   . Diabetes Paternal Aunt   . Heart attack Paternal Aunt     SOCIAL HISTORY:  Social History   Social History  . Marital status: Single    Spouse name: N/A    . Number of children: 3  . Years of education: 10th   Occupational History  . Floor Tech    Social History Main Topics  . Smoking status: Current Every Day Smoker    Packs/day: 0.25    Types: Cigarettes  . Smokeless tobacco: Never Used  . Alcohol use Yes     Comment: social  . Drug use: No  . Sexual activity: Yes    Birth control/ protection: Surgical   Other Topics Concern  . Not on file   Social History Narrative   Right-handed.   Lives at home with children.   Occasional caffeine use.     PHYSICAL EXAM   Vitals:   06/09/16 1306  BP: 128/82  Pulse: 77  Weight: 167 lb (75.8 kg)  Height: '5\' 3"'  (1.6 m)    Not recorded      Body mass index is 29.58 kg/m.  PHYSICAL EXAMNIATION:  Gen: NAD, conversant, well nourised, obese, well groomed                     Cardiovascular: Regular rate rhythm, no peripheral edema, warm, nontender. Eyes: Conjunctivae clear without exudates or hemorrhage Neck: Supple, no carotid bruise. Pulmonary: Clear to auscultation bilaterally   NEUROLOGICAL EXAM:  MENTAL STATUS: Speech:    Speech is normal; fluent and spontaneous with normal comprehension.  Cognition:     Orientation to time, place and person     Normal recent and remote memory     Normal Attention span and concentration     Normal Language, naming, repeating,spontaneous speech     Fund of knowledge   CRANIAL NERVES: CN II: Fundoscopic exam is normal with sharp discs and no vascular changes. Pupils are round equal and briskly reactive to light.  She has decreased visual field on left confrontational test, CN III, IV, VI: extraocular movement are normal. No ptosis. CN V: Facial sensation is intact to pinprick in all 3 divisions bilaterally. Corneal responses are intact.  CN VII: Face is symmetric with normal eye closure and smile. CN VIII: Hearing is normal to rubbing fingers CN IX, X: Palate elevates symmetrically. Phonation is normal. CN XI: Head turning and  shoulder shrug are intact CN XII: Tongue is midline with normal movements and no atrophy.  MOTOR: There is no pronator drift of out-stretched arms. Muscle bulk and tone are normal. Muscle strength is normal.  REFLEXES: Reflexes are 2+ and symmetric at the biceps, triceps, knees, and ankles. Plantar responses are flexor.  SENSORY: Intact to light touch, pinprick, positional sensation and vibratory sensation are intact in fingers and toes.  COORDINATION: Rapid alternating movements and fine finger movements are intact. There is no dysmetria on finger-to-nose and heel-knee-shin.    GAIT/STANCE: Posture is normal. Gait is steady with normal steps,  base, arm swing, and turning. Heel and toe walking are normal. Tandem gait is normal.  Romberg is absent.   DIAGNOSTIC DATA (LABS, IMAGING, TESTING) - I reviewed patient records, labs, notes, testing and imaging myself where available.   ASSESSMENT AND PLAN  SABRINIA PRIEN is a 41 y.o. female   Left visual disturbance,  Complains of Visual field deficit at left eye inferior temporal visual field  MRI of the brain with without contrast showed no significant structural lesion  Refer her to ophthalmologist Vitamin D deficiency   Continue vitamin D supplements   Marcial Pacas, M.D. Ph.D.  [facility] [address] [place], [place] [address] Ph: [phone] Fax: [phone]  CC: Celestia Khat, [place], her primary care physician is Seabron Spates, CNM

## 2016-06-19 ENCOUNTER — Encounter: Payer: Self-pay | Admitting: Family Medicine

## 2016-06-30 ENCOUNTER — Ambulatory Visit (INDEPENDENT_AMBULATORY_CARE_PROVIDER_SITE_OTHER): Payer: Self-pay | Admitting: Family Medicine

## 2016-06-30 ENCOUNTER — Encounter: Payer: Self-pay | Admitting: Family Medicine

## 2016-06-30 VITALS — BP 137/86 | HR 84

## 2016-06-30 DIAGNOSIS — N920 Excessive and frequent menstruation with regular cycle: Secondary | ICD-10-CM

## 2016-06-30 DIAGNOSIS — R102 Pelvic and perineal pain: Secondary | ICD-10-CM

## 2016-06-30 DIAGNOSIS — D251 Intramural leiomyoma of uterus: Secondary | ICD-10-CM

## 2016-06-30 DIAGNOSIS — D500 Iron deficiency anemia secondary to blood loss (chronic): Secondary | ICD-10-CM

## 2016-06-30 MED ORDER — IBUPROFEN 600 MG PO TABS: 600.0000 mg | ORAL_TABLET | Freq: Four times a day (QID) | ORAL | 3 refills | Status: DC | PRN

## 2016-06-30 NOTE — Assessment & Plan Note (Signed)
Hopefully will improve if we get bleeding under control.

## 2016-06-30 NOTE — Assessment & Plan Note (Signed)
Trial of Endometrial ablation--may need hysterectomy if HTA is unsuccessful.

## 2016-06-30 NOTE — Patient Instructions (Signed)
Endometrial Ablation Endometrial ablation removes the lining of the uterus (endometrium). It is usually a same-day, outpatient treatment. Ablation helps avoid major surgery, such as surgery to remove the cervix and uterus (hysterectomy). After endometrial ablation, you will have little or no menstrual bleeding and may not be able to have children. However, if you are premenopausal, you will need to use a reliable method of birth control following the procedure because of the small chance that pregnancy can occur. There are different reasons to have this procedure. These reasons include:  Heavy periods.  Bleeding that is causing anemia.  Irregular bleeding.  Bleeding fibroids on the lining inside the uterus if they are smaller than 3 centimeters. This procedure may not be possible for you if:   You want to have children in the future.   You have severe cramps with your menstrual period.   You have precancerous or cancerous cells in your uterus.   You were recently pregnant.   You have gone through menopause.   You have had major surgery on your uterus, resulting in thinning of the uterine wall. Surgeries may include:  The removal of one or more uterine fibroids (myomectomy).  A cesarean section with a classic (vertical) incision on your uterus. Ask your health care provider what type of cesarean you had. Sometimes the scar on your skin is different than the scar on your uterus. Even if you have had surgery on your uterus, certain types of ablation may still be safe for you. Talk with your health care provider. LET YOUR HEALTH CARE PROVIDER KNOW ABOUT:  Any allergies you have.  All medicines you are taking, including vitamins, herbs, eye drops, creams, and over-the-counter medicines.  Previous problems you or members of your family have had with the use of anesthetics.  Any blood disorders you have.  Previous surgeries you have had.  Medical conditions you have. RISKS AND  COMPLICATIONS  Generally, this is a safe procedure. However, as with any procedure, complications can occur. Possible complications include:  Perforation of the uterus.  Bleeding.  Infection of the uterus, bladder, or vagina.  Injury to surrounding organs.  An air bubble to the lung (air embolus).  Pregnancy following the procedure.  Failure of the procedure to help the problem, requiring hysterectomy.  Decreased ability to diagnose cancer in the lining of the uterus. BEFORE THE PROCEDURE  The lining of the uterus must be tested to make sure there is no pre-cancerous or cancer cells present.  An ultrasound may be performed to look at the size of the uterus and to check for abnormalities.  Medicines may be given to thin the lining of the uterus. PROCEDURE  During the procedure, your health care provider will use a tool called a resectoscope to help see inside your uterus. There are different ways to remove the lining of your uterus.   Radiofrequency - This method uses a radiofrequency-alternating electric current to remove the lining of the uterus.  Cryotherapy - This method uses extreme cold to freeze the lining of the uterus.  Heated-Free Liquid - This method uses heated salt (saline) solution to remove the lining of the uterus.  Microwave - This method uses high-energy microwaves to heat up the lining of the uterus to remove it.  Thermal balloon - This method involves inserting a catheter with a balloon tip into the uterus. The balloon tip is filled with heated fluid to remove the lining of the uterus. AFTER THE PROCEDURE  After your procedure, do   not have sexual intercourse or insert anything into your vagina until permitted by your health care provider. After the procedure, you may experience:  Cramps.  Vaginal discharge.  Frequent urination. This information is not intended to replace advice given to you by your health care provider. Make sure you discuss any  questions you have with your health care provider. Document Released: 03/28/2004 Document Revised: 02/07/2015 Document Reviewed: 10/20/2012 Elsevier Interactive Patient Education  2017 Elsevier Inc.  

## 2016-06-30 NOTE — Progress Notes (Signed)
   Subjective:    Patient ID: Jean Ferguson is a 41 y.o. female presenting with Follow-up  on 06/30/2016  HPI: Here for f/u. Previously with menorrhagia and had IUD placed. Had that removed for strings being too long. Placed on Megace. Now here with further bleeding. Had anemia of Hgb 8.2. Had stopped her iron. She continues to have bleeding despite 3x/day Megace. Sometimes it is heavy, many times it is much lighter than previously. She is also having pain periodically, using Ibuprofen.  Review of Systems  Constitutional: Negative for chills and fever.  Respiratory: Negative for shortness of breath.   Cardiovascular: Negative for chest pain.  Gastrointestinal: Negative for abdominal pain, nausea and vomiting.  Genitourinary: Negative for dysuria.  Skin: Negative for rash.      Objective:     Vitals:   06/30/16 1454  BP: 137/86  Pulse: 84    Physical Exam  Constitutional: She is oriented to person, place, and time. She appears well-developed and well-nourished. No distress.  HENT:  Head: Normocephalic and atraumatic.  Eyes: No scleral icterus.  Neck: Neck supple.  Cardiovascular: Normal rate.   Pulmonary/Chest: Effort normal.  Abdominal: Soft.  Neurological: She is alert and oriented to person, place, and time.  Skin: Skin is warm and dry.  Psychiatric: She has a normal mood and affect.        Assessment & Plan:   Problem List Items Addressed This Visit      Unprioritized   Fibroid uterus    Trial of Endometrial ablation--may need hysterectomy if HTA is unsuccessful.      Chronic blood loss anemia    Hopefully will improve if we get bleeding under control.      Menorrhagia - Primary    Continue Megace until definitive surgery.       Other Visit Diagnoses    Pelvic pain in female       Relevant Medications   ibuprofen (ADVIL,MOTRIN) 600 MG tablet       Total face-to-face time with patient: 25 minutes. Over 50% of encounter was spent on counseling  and coordination of care. Return in about 3 months (around 09/28/2016) for postop check.  Donnamae Jude 06/30/2016 2:52 PM

## 2016-06-30 NOTE — Assessment & Plan Note (Signed)
Continue Megace until definitive surgery.

## 2016-07-09 ENCOUNTER — Encounter (HOSPITAL_COMMUNITY): Payer: Self-pay

## 2016-07-29 NOTE — H&P (Signed)
  Jean Ferguson is an 41 y.o. [phone] female.   Chief Complaint: Heavy menstrual bleeding HPI: Patient with h/o menstrual bleeding, failed IUD and Megace. Has known fibroids. 3 prior C-sections.   Past Medical History:  Diagnosis Date  . Fibroids   . Optic nerve swelling     Past Surgical History:  Procedure Laterality Date  . CESAREAN SECTION     x 3  . TUBAL LIGATION      Family History  Problem Relation Age of Onset  . Diabetes Mother   . Lung cancer Father   . Diabetes Father   . Heart attack Paternal Grandfather   . Stroke Paternal Uncle   . Diabetes Paternal Uncle   . Heart attack Paternal Uncle     x 3 brothers  . Stroke Paternal Aunt   . Diabetes Paternal Aunt   . Heart attack Paternal Aunt    Social History:  reports that she has been smoking Cigarettes.  She has been smoking about 0.25 packs per day. She has never used smokeless tobacco. She reports that she drinks alcohol. She reports that she does not use drugs.  Allergies:  Allergies  Allergen Reactions  . Morphine And Related     Pt states she feels like she is on fire inside    No current facility-administered medications on file prior to encounter.    Current Outpatient Prescriptions on File Prior to Encounter  Medication Sig Dispense Refill  . acetaminophen (TYLENOL) 500 MG tablet Take 500 mg by mouth every 6 (six) hours as needed for moderate pain.    . cholecalciferol (VITAMIN D) 1000 units tablet Take 1,000 Units by mouth daily.    Marland Kitchen docusate sodium (COLACE) 100 MG capsule Take 1 capsule (100 mg total) by mouth 2 (two) times daily as needed. 30 capsule 2  . ferrous sulfate (FERROUSUL) 325 (65 FE) MG tablet Take 1 tablet (325 mg total) by mouth 2 (two) times daily. 60 tablet 1  . ibuprofen (ADVIL,MOTRIN) 600 MG tablet Take 1 tablet (600 mg total) by mouth every 6 (six) hours as needed. 30 tablet 3  . megestrol (MEGACE) 40 MG tablet Take 1 tablet (40 mg total) by mouth daily. (Patient taking  differently: Take 120 mg by mouth daily. ) 60 tablet 5    A comprehensive review of systems was negative.  There were no vitals taken for this visit. General appearance: alert, cooperative and appears stated age Neck: supple, symmetrical, trachea midline Lungs: normal effort Heart: regular rate and rhythm Abdomen: soft, non-tender; bowel sounds normal; no masses,  no organomegaly Extremities: Homans sign is negative, no sign of DVT Skin: Skin color, texture, turgor normal. No rashes or lesions Neurologic: Grossly normal   Lab Results  Component Value Date   WBC 7.8 02/06/2016   HGB 8.2 (L) 02/06/2016   HCT 27.7 (L) 02/06/2016   MCV 71.8 (L) 02/06/2016   PLT 487 (H) 02/06/2016   Lab Results  Component Value Date   PREGTESTUR NEGATIVE 02/06/2016     Assessment/Plan Principal Problem:   Menorrhagia Active Problems:   Fibroid uterus   Chronic blood loss anemia  For D & c with Hysteroscopy and HTA and possible hysteroscopic resection of fibroids. Risks include but are not limited to bleeding, infection, injury to surrounding structures, including bowel, bladder and ureters, blood clots, and death.  Likelihood of success is moderate.    Donnamae Jude 07/29/2016, 1:12 PM

## 2016-08-08 ENCOUNTER — Ambulatory Visit (HOSPITAL_COMMUNITY): Admission: RE | Admit: 2016-08-08 | Payer: Self-pay | Source: Ambulatory Visit | Admitting: Family Medicine

## 2016-08-08 ENCOUNTER — Encounter (HOSPITAL_COMMUNITY): Admission: RE | Payer: Self-pay | Source: Ambulatory Visit

## 2016-08-08 SURGERY — DILATATION & CURETTAGE/HYSTEROSCOPY WITH HYDROTHERMAL ABLATION
Anesthesia: Choice | Site: Abdomen

## 2016-08-25 ENCOUNTER — Ambulatory Visit: Payer: BLUE CROSS/BLUE SHIELD | Admitting: Family Medicine

## 2016-09-08 ENCOUNTER — Telehealth: Payer: Self-pay | Admitting: *Deleted

## 2016-09-08 ENCOUNTER — Ambulatory Visit: Payer: Medicaid Other | Admitting: Neurology

## 2016-09-08 NOTE — Telephone Encounter (Signed)
No showed follow up appointment. 

## 2016-09-09 ENCOUNTER — Encounter: Payer: Self-pay | Admitting: Neurology

## 2016-10-21 ENCOUNTER — Other Ambulatory Visit (HOSPITAL_COMMUNITY): Payer: Self-pay | Admitting: Advanced Practice Midwife

## 2016-10-22 NOTE — Telephone Encounter (Signed)
Jean Ferguson at [facility], states patient is requesting a refill on her megace. Please return call.

## 2017-01-18 ENCOUNTER — Encounter (HOSPITAL_COMMUNITY): Payer: Self-pay

## 2017-01-18 ENCOUNTER — Emergency Department (HOSPITAL_COMMUNITY)
Admission: EM | Admit: 2017-01-18 | Discharge: 2017-01-18 | Disposition: A | Discharge status: Home / Self Care | Payer: Medicaid Other | Attending: Emergency Medicine | Admitting: Emergency Medicine

## 2017-01-18 DIAGNOSIS — K047 Periapical abscess without sinus: Secondary | ICD-10-CM

## 2017-01-18 DIAGNOSIS — Z79899 Other long term (current) drug therapy: Secondary | ICD-10-CM | POA: Insufficient documentation

## 2017-01-18 DIAGNOSIS — R220 Localized swelling, mass and lump, head: Secondary | ICD-10-CM

## 2017-01-18 DIAGNOSIS — F17210 Nicotine dependence, cigarettes, uncomplicated: Secondary | ICD-10-CM | POA: Insufficient documentation

## 2017-01-18 MED ORDER — CLINDAMYCIN HCL 150 MG PO CAPS
300.0000 mg | ORAL_CAPSULE | Freq: Once | ORAL | Status: AC
  Administered 2017-01-18: 300 mg via ORAL
  Filled 2017-01-18: qty 2

## 2017-01-18 MED ORDER — HYDROCODONE-ACETAMINOPHEN 5-325 MG PO TABS: 2.0000 | ORAL_TABLET | ORAL | 0 refills | Status: DC | PRN

## 2017-01-18 MED ORDER — DEXAMETHASONE 10 MG/ML FOR PEDIATRIC ORAL USE: 10.0000 mg | Freq: Once | INTRAMUSCULAR | Status: DC

## 2017-01-18 MED ORDER — DEXAMETHASONE 4 MG PO TABS
10.0000 mg | ORAL_TABLET | Freq: Once | ORAL | Status: AC
  Administered 2017-01-18: 10 mg via ORAL
  Filled 2017-01-18: qty 3

## 2017-01-18 MED ORDER — CLINDAMYCIN HCL 300 MG PO CAPS: 300.0000 mg | ORAL_CAPSULE | Freq: Three times a day (TID) | ORAL | 0 refills | Status: DC

## 2017-01-18 NOTE — ED Triage Notes (Signed)
Pt has bad teeth on the upper gumline. Started having facial swelling to left side of her face Friday and progressively has gotten worse. Woke up this morning with face throbbing.

## 2017-01-18 NOTE — ED Notes (Signed)
ED Provider at bedside. 

## 2017-01-18 NOTE — ED Provider Notes (Signed)
[place] DEPT Provider Note   CSN: [id number] Arrival date & time: 01/18/17  4098     History   Chief Complaint Chief Complaint  Patient presents with  . Facial Swelling  . Abscess    HPI Jean Ferguson is a 41 y.o. female.  HPI 41 year old African-American female presents to the ED with complaints of tooth abscess and facial swelling. Patient states that "I have bad teeth'. Patient has not followed up with a dentist due to lack of insurance. States that for the past 2 days she's had significant pain in her left upper molars along with increasing facial swelling which has progressively worsened until today. Patient states she woke up this morning with her face and throbbing. She denies any difficulty breathing, difficulty swallowing, fevers. She has been taken 600 ibuprofen with little relief. Nothing makes better or worse. Able tolerate by mouth fluids.patient denies any vision changes, eye pain, headaches, fever, chills, shortness of breath, difficulty swallowing. Past Medical History:  Diagnosis Date  . Fibroids   . Optic nerve swelling     Patient Active Problem List   Diagnosis Date Noted  . 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 Visual disturbance 02/25/2016  . Headache 02/25/2016  . 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 Menorrhagia 02/06/2016  . Fibroid uterus 01/27/2016    Past Surgical History:  Procedure Laterality Date  . CESAREAN SECTION     x 3  . TUBAL LIGATION      OB History    Gravida Para Term Preterm AB Living   3 3   3   3    SAB TAB Ectopic Multiple Live Births           3       Home Medications    Prior to Admission medications   Medication Sig Start Date End Date Taking? Authorizing Provider  acetaminophen (TYLENOL) 500 MG tablet Take 500 mg by mouth every 6 (six) hours as needed for moderate pain.    [provider]  cholecalciferol (VITAMIN D) 1000 units tablet Take 1,000 Units by mouth daily.    [provider]    clindamycin (CLEOCIN) 300 MG capsule Take 1 capsule (300 mg total) by mouth 3 (three) times daily. 01/18/17   Doristine Devoid, PA-C  docusate sodium (COLACE) 100 MG capsule Take 1 capsule (100 mg total) by mouth 2 (two) times daily as needed. 03/17/16   Guss Bunde, MD  ferrous sulfate (FERROUSUL) 325 (65 FE) MG tablet Take 1 tablet (325 mg total) by mouth 2 (two) times daily. 03/17/16   Guss Bunde, MD  HYDROcodone-acetaminophen (NORCO/VICODIN) 5-325 MG tablet Take 2 tablets by mouth every 4 (four) hours as needed. 01/18/17   Doristine Devoid, PA-C  ibuprofen (ADVIL,MOTRIN) 600 MG tablet Take 1 tablet (600 mg total) by mouth every 6 (six) hours as needed. 06/30/16   Donnamae Jude, MD  megestrol (MEGACE) 40 MG tablet Take 1 tablet (40 mg total) by mouth daily. Patient taking differently: Take 120 mg by mouth daily.  01/05/16   Seabron Spates, CNM    Family History Family History  Problem Relation Age of Onset  . Diabetes Mother   . Lung cancer Father   . Diabetes Father   . Heart attack Paternal Grandfather   . Stroke Paternal Uncle   . Diabetes Paternal Uncle   . Heart attack Paternal Uncle        x 3 brothers  . Stroke Paternal Aunt   .  Diabetes Paternal Aunt   . Heart attack Paternal Aunt     Social History Social History  Substance Use Topics  . Smoking status: Current Every Day Smoker    Packs/day: 0.25    Types: Cigarettes  . Smokeless tobacco: Never Used  . Alcohol use Yes     Comment: social     Allergies   Morphine and related   Review of Systems Review of Systems  Constitutional: Negative for chills and fever.  HENT: Positive for dental problem. Negative for sore throat, trouble swallowing and voice change.   Eyes: Negative for pain and visual disturbance.  Respiratory: Negative for shortness of breath.   Gastrointestinal: Negative for nausea and vomiting.  Musculoskeletal: Negative for neck pain.  Skin: Negative for color change.   Neurological: Negative for headaches.  Psychiatric/Behavioral: Negative for sleep disturbance.     Physical Exam Updated Vital Signs BP 110/86   Pulse 75   Temp 99.1 F (37.3 C) (Oral)   Resp 18   Ht 5\' 3"  (1.6 m)   Wt 87.1 kg (192 lb)   SpO2 99%   BMI 34.01 kg/m   Physical Exam  Constitutional: She appears well-developed and well-nourished. No distress.  HENT:  Head: Normocephalic and atraumatic.  Patient with poor dentition throughout. Dental caries noted to the left upper molars. No gingival edema or erythema noted. No gross abscess. No purulent drainage. Mildly tender to palpation. No trismus is noted. Uvula is midline. Mild left-sided facial swelling over the left upper molars. No sublingual or submandibular swelling. No cervical adenopathy appreciated. Patient managing secretions and maintaining her airway. Speaking complete sentences. No muffled voice.  Eyes: Pupils are equal, round, and reactive to light. Conjunctivae and EOM are normal. Right eye exhibits no discharge. Left eye exhibits no discharge. No scleral icterus.  Neck: Normal range of motion. Neck supple.  No c spine midline tenderness. No paraspinal tenderness. No deformities or step offs noted. Full ROM. Supple. No nuchal rigidity.    Cardiovascular: Normal rate and regular rhythm.   Pulmonary/Chest: Effort normal and breath sounds normal. No respiratory distress.  Musculoskeletal: Normal range of motion.  Neurological: She is alert.  Skin: Skin is warm and dry. No pallor.  Psychiatric: Her behavior is normal. Judgment and thought content normal.  Nursing note and vitals reviewed.    ED Treatments / Results  Labs (all labs ordered are listed, but only abnormal results are displayed) Labs Reviewed - No data to display  EKG  EKG Interpretation None       Radiology No results found.  Procedures Procedures (including critical care time)  Medications Ordered in ED Medications  dexamethasone  (DECADRON) tablet 10 mg (not administered)  clindamycin (CLEOCIN) capsule 300 mg (not administered)     Initial Impression / Assessment and Plan / ED Course  I have reviewed the triage vital signs and the nursing notes.  Pertinent labs & imaging results that were available during my care of the patient were reviewed by me and considered in my medical decision making (see chart for details).     Patient presents to the ED today with complaints of left facial swelling, left teeth abscess. Patient has poor dentition at baseline. Has not followed up with a dentist due to lack of insurance. Facial swelling has progressively worsened over the past 2 days.  Vital signs are reassuring. Patient is afebrile, no tachycardia. Blood pressure is normal.  On exam patient has no trismus. No sublingual or submandibular swelling that  would be concerning for Ludwig's angina.patient has no uvula deviation or muffled voice of that would be concerning for peritonsillar abscess or deep neck infection. EOMs are intact. Vision is normal. Low suspicion for orbital or septal cellulitis. No erythema noted of the face. Patient's symptoms likely secondary to abscessed tooth. Do not feel that imaging in blood work is indicated at this time. We'll try course of antibiotics and pain medicine. Patient given dental resources.  Pt is hemodynamically stable, in NAD, & able to ambulate in the ED. Evaluation does not show pathology that would require ongoing emergent intervention or inpatient treatment. I explained the diagnosis to the patient. Pain has been managed & has no complaints prior to dc. Pt is comfortable with above plan and is stable for discharge at this time. All questions were answered prior to disposition. Strict return precautions for f/u to the ED were discussed. Encouraged follow up with PCP.  Discussed with my attending who is agreeable to above plan. Final Clinical Impressions(s) / ED Diagnoses   Final  diagnoses:  Facial swelling  Dental abscess    New Prescriptions New Prescriptions   CLINDAMYCIN (CLEOCIN) 300 MG CAPSULE    Take 1 capsule (300 mg total) by mouth 3 (three) times daily.   HYDROCODONE-ACETAMINOPHEN (NORCO/VICODIN) 5-325 MG TABLET    Take 2 tablets by mouth every 4 (four) hours as needed.     Doristine Devoid, PA-C 01/18/17 0847    Tanna Furry, MD 01/25/17 [phone]

## 2017-01-18 NOTE — Discharge Instructions (Signed)
Please take the antibiotic as prescribed. Use the hydrocodone at night for pain and help her to sleep. Continue taking ibuprofen every 4-6 hours. Please follow-up with dentist. If you develop any difficulties opening her mouth, difficulty breathing, difficulty swallowing, swelling of the lower part of your face or worsening fevers return to the ED.

## 2017-01-26 ENCOUNTER — Encounter (HOSPITAL_COMMUNITY): Payer: Self-pay | Admitting: Emergency Medicine

## 2017-01-26 DIAGNOSIS — Z5321 Procedure and treatment not carried out due to patient leaving prior to being seen by health care provider: Secondary | ICD-10-CM | POA: Insufficient documentation

## 2017-01-26 DIAGNOSIS — R51 Headache: Secondary | ICD-10-CM | POA: Insufficient documentation

## 2017-01-26 NOTE — ED Triage Notes (Signed)
Pt presents with sharp shooting pains in her head ongoing for 3-4 months but worse today; pt denies any other symptoms such as nausea, vision changes, photophobia, syncope; pt reports prior MRI which showed 4 questionable spots to keep an eye on; pt states she follows neurologist with last visit in July

## 2017-01-27 ENCOUNTER — Emergency Department (HOSPITAL_COMMUNITY)
Admission: EM | Admit: 2017-01-27 | Discharge: 2017-01-27 | Disposition: A | Discharge status: Home / Self Care | Payer: Medicaid Other | Attending: Emergency Medicine | Admitting: Emergency Medicine

## 2017-01-27 NOTE — ED Notes (Signed)
No answer in lobby when called to room.

## 2017-01-27 NOTE — ED Provider Notes (Cosign Needed)
Patient eloped from the emergency department before provider evaluation. I did not see or evaluate this patient. Triage note states patient with sharp shooting pain in her head ongoing 3-4 months worse today. Patient previous MRI which showed "for questionable spots to keep an eye on."  I did review her MRI which showed approximately 12 lesions consistent with migraine headaches. No lesions consistent with MS.    Abigail Butts, [place] 01/27/17 1314

## 2017-02-25 ENCOUNTER — Encounter: Payer: Self-pay | Admitting: Family Medicine

## 2017-02-25 ENCOUNTER — Ambulatory Visit (INDEPENDENT_AMBULATORY_CARE_PROVIDER_SITE_OTHER): Payer: BC Managed Care – PPO | Admitting: Family Medicine

## 2017-02-25 VITALS — BP 125/86 | HR 72 | Ht 63.0 in | Wt 188.7 lb

## 2017-02-25 DIAGNOSIS — N920 Excessive and frequent menstruation with regular cycle: Secondary | ICD-10-CM | POA: Diagnosis not present

## 2017-02-25 DIAGNOSIS — R102 Pelvic and perineal pain: Secondary | ICD-10-CM | POA: Diagnosis not present

## 2017-02-25 MED ORDER — MEGESTROL ACETATE 40 MG PO TABS: 40.0000 mg | ORAL_TABLET | Freq: Three times a day (TID) | ORAL | 2 refills | Status: DC

## 2017-02-25 MED ORDER — IBUPROFEN 800 MG PO TABS: 800.0000 mg | ORAL_TABLET | Freq: Four times a day (QID) | ORAL | 2 refills | Status: DC | PRN

## 2017-02-25 MED ORDER — FERROUS SULFATE 325 (65 FE) MG PO TABS: 325.0000 mg | ORAL_TABLET | Freq: Two times a day (BID) | ORAL | 1 refills | Status: DC

## 2017-02-25 NOTE — Patient Instructions (Signed)
Endometrial Ablation Endometrial ablation is a procedure that destroys the thin inner layer of the lining of the uterus (endometrium). This procedure may be done:  To stop heavy periods.  To stop bleeding that is causing anemia.  To control irregular bleeding.  To treat bleeding caused by small tumors (fibroids) in the endometrium.  This procedure is often an alternative to major surgery, such as removal of the uterus and cervix (hysterectomy). As a result of this procedure:  You may not be able to have children. However, if you are premenopausal (you have not gone through menopause): ? You may still have a small chance of getting pregnant. ? You will need to use a reliable method of birth control after the procedure to prevent pregnancy.  You may stop having a menstrual period, or you may have only a small amount of bleeding during your period. Menstruation may return several years after the procedure.  Tell a health care provider about:  Any allergies you have.  All medicines you are taking, including vitamins, herbs, eye drops, creams, and over-the-counter medicines.  Any problems you or family members have had with the use of anesthetic medicines.  Any blood disorders you have.  Any surgeries you have had.  Any medical conditions you have. What are the risks? Generally, this is a safe procedure. However, problems may occur, including:  A hole (perforation) in the uterus or bowel.  Infection of the uterus, bladder, or vagina.  Bleeding.  Damage to other structures or organs.  An air bubble in the lung (air embolus).  Problems with pregnancy after the procedure.  Failure of the procedure.  Decreased ability to diagnose cancer in the endometrium.  What happens before the procedure?  You will have tests of your endometrium to make sure there are no pre-cancerous cells or cancer cells present.  You may have an ultrasound of the uterus.  You may be given  medicines to thin the endometrium.  Ask your health care provider about: ? Changing or stopping your regular medicines. This is especially important if you take diabetes medicines or blood thinners. ? Taking medicines such as aspirin and ibuprofen. These medicines can thin your blood. Do not take these medicines before your procedure if your doctor tells you not to.  Plan to have someone take you home from the hospital or clinic. What happens during the procedure?  You will lie on an exam table with your feet and legs supported as in a pelvic exam.  To lower your risk of infection: ? Your health care team will wash or sanitize their hands and put on germ-free (sterile) gloves. ? Your genital area will be washed with soap.  An IV tube will be inserted into one of your veins.  You will be given a medicine to help you relax (sedative).  A surgical instrument with a light and camera (resectoscope) will be inserted into your vagina and moved into your uterus. This allows your surgeon to see inside your uterus.  Endometrial tissue will be removed using one of the following methods: ? Radiofrequency. This method uses a radiofrequency-alternating electric current to remove the endometrium. ? Cryotherapy. This method uses extreme cold to freeze the endometrium. ? Heated-free liquid. This method uses a heated saltwater (saline) solution to remove the endometrium. ? Microwave. This method uses high-energy microwaves to heat up the endometrium and remove it. ? Thermal balloon. This method involves inserting a catheter with a balloon tip into the uterus. The balloon tip is   filled with heated fluid to remove the endometrium. The procedure may vary among health care providers and hospitals. What happens after the procedure?  Your blood pressure, heart rate, breathing rate, and blood oxygen level will be monitored until the medicines you were given have worn off.  As tissue healing occurs, you may  notice vaginal bleeding for 4-6 weeks after the procedure. You may also experience: ? Cramps. ? Thin, watery vaginal discharge that is light pink or brown in color. ? A need to urinate more frequently than usual. ? Nausea.  Do not drive for 24 hours if you were given a sedative.  Do not have sex or insert anything into your vagina until your health care provider approves. Summary  Endometrial ablation is done to treat the many causes of heavy menstrual bleeding.  The procedure may be done only after medications have been tried to control the bleeding.  Plan to have someone take you home from the hospital or clinic. This information is not intended to replace advice given to you by your health care provider. Make sure you discuss any questions you have with your health care provider. Document Released: 03/28/2004 Document Revised: 06/05/2016 Document Reviewed: 06/05/2016 Elsevier Interactive Patient Education  2017 [employer]vier Inc[employer]  

## 2017-02-25 NOTE — Progress Notes (Signed)
   Subjective:    Patient ID: LIISA PICONE is a 41 y.o. female presenting with Advice Only  on 02/25/2017  HPI: Here for f/u. Has h/o menorrhagia with IUD placement, which failed and then booked for HTA. Her insurance lapsed and she canceled that. She is out of Megace and has begun bleeding again. Still would like surgery. She has a h/o C-section x 3. Has painful cycles and would like to have a dose increase on her Ibuprofen.  Review of Systems  Constitutional: Negative for chills and fever.  Respiratory: Negative for shortness of breath.   Cardiovascular: Negative for chest pain.  Gastrointestinal: Negative for abdominal pain, nausea and vomiting.  Genitourinary: Positive for vaginal bleeding. Negative for dysuria.  Skin: Negative for rash.      Objective:    BP 125/86   Pulse 72   Ht 5\' 3"  (1.6 m)   Wt 188 lb 11.2 oz (85.6 kg)   LMP  (LMP Unknown)   BMI 33.43 kg/m  Physical Exam  Constitutional: She is oriented to person, place, and time. She appears well-developed and well-nourished. No distress.  HENT:  Head: Normocephalic and atraumatic.  Eyes: No scleral icterus.  Neck: Neck supple.  Cardiovascular: Normal rate.   Pulmonary/Chest: Effort normal.  Abdominal: Soft.  Neurological: She is alert and oriented to person, place, and time.  Skin: Skin is warm and dry.  Psychiatric: She has a normal mood and affect.        Assessment & Plan:   Problem List Items Addressed This Visit      Unprioritized   Menorrhagia - Primary    Megace until surgery--attempt to re-book for D & C, hysteroscopy with HTA. Risks, benefits, alternatives to treatment reviewed again. Ibuprofen 800 mg given--risks of this med given also.      Relevant Medications   megestrol (MEGACE) 40 MG tablet   ferrous sulfate (FERROUSUL) 325 (65 FE) MG tablet    Other Visit Diagnoses    Pelvic pain in female       Relevant Medications   ibuprofen (ADVIL,MOTRIN) 800 MG tablet      Total  face-to-face time with patient: 25 minutes. Over 50% of encounter was spent on counseling and coordination of care. Return in about 3 months (around 05/27/2017) for postop check.  Donnamae Jude 02/25/2017 11:00 AM

## 2017-02-25 NOTE — Assessment & Plan Note (Addendum)
Megace until surgery--attempt to re-book for D & C, hysteroscopy with HTA. Risks, benefits, alternatives to treatment reviewed again. Ibuprofen 800 mg given--risks of this med given also.

## 2017-02-27 ENCOUNTER — Encounter (HOSPITAL_COMMUNITY): Payer: Self-pay

## 2017-03-12 NOTE — H&P (Signed)
Jean Ferguson is an 41 y.o. [phone] female.   Chief Complaint: abnormal bleeding HPI: Here for f/u. Previously with menorrhagia and had IUD placed. Had that removed for strings being too long. Placed on Megace. Now here with further bleeding. Had anemia of Hgb 8.2. Had stopped her iron. She continues to have bleeding despite 3x/day Megace. Sometimes it is heavy, many times it is much lighter than previously. She is also having pain periodically, using Ibuprofen  Past Medical History:  Diagnosis Date  . Fibroids   . Optic nerve swelling     Past Surgical History:  Procedure Laterality Date  . CESAREAN SECTION     x 3  . TUBAL LIGATION      Family History  Problem Relation Age of Onset  . Diabetes Mother   . Lung cancer Father   . Diabetes Father   . Heart attack Paternal Grandfather   . Stroke Paternal Uncle   . Diabetes Paternal Uncle   . Heart attack Paternal Uncle        x 3 brothers  . Stroke Paternal Aunt   . Diabetes Paternal Aunt   . Heart attack Paternal Aunt    Social History:  reports that she has been smoking Cigarettes.  She has been smoking about 0.25 packs per day. She has never used smokeless tobacco. She reports that she drinks alcohol. She reports that she does not use drugs.  Allergies:  Allergies  Allergen Reactions  . Morphine And Related     Pt states she feels like she is on fire inside    No current facility-administered medications on file prior to encounter.    Current Outpatient Prescriptions on File Prior to Encounter  Medication Sig Dispense Refill  . acetaminophen (TYLENOL) 500 MG tablet Take 500 mg by mouth every 6 (six) hours as needed for moderate pain.    . cholecalciferol (VITAMIN D) 1000 units tablet Take 1,000 Units by mouth daily.    . ferrous sulfate (FERROUSUL) 325 (65 FE) MG tablet Take 1 tablet (325 mg total) by mouth 2 (two) times daily. 60 tablet 1  . ibuprofen (ADVIL,MOTRIN) 800 MG tablet Take 1 tablet (800 mg total) by  mouth every 6 (six) hours as needed. 48 tablet 2  . megestrol (MEGACE) 40 MG tablet Take 1 tablet (40 mg total) by mouth 3 (three) times daily. 90 tablet 2    A comprehensive review of systems was negative.  There were no vitals taken for this visit. General appearance: alert, cooperative and appears stated age Head: Normocephalic, without obvious abnormality, atraumatic Neck: supple, symmetrical, trachea midline Lungs: normal effort Heart: regular rate and rhythm Abdomen: soft, non-tender; bowel sounds normal; no masses,  no organomegaly Extremities: Homans sign is negative, no sign of DVT Skin: Skin color, texture, turgor normal. No rashes or lesions Neurologic: Grossly normal   Lab Results  Component Value Date   WBC 7.8 02/06/2016   HGB 8.2 (L) 02/06/2016   HCT 27.7 (L) 02/06/2016   MCV 71.8 (L) 02/06/2016   PLT 487 (H) 02/06/2016   Lab Results  Component Value Date   PREGTESTUR NEGATIVE 02/06/2016     Assessment/Plan Principal Problem:   Menorrhagia Active Problems:   Fibroid uterus   Chronic blood loss anemia  For HTA with endometrial sampling and Hysteroscopy Risks include but are not limited to bleeding, infection, injury to surrounding structures, including bowel, bladder and ureters, blood clots, and death.  Likelihood of success is high.  Jean Ferguson 03/12/2017, 2:22 PM

## 2017-03-20 ENCOUNTER — Encounter (HOSPITAL_BASED_OUTPATIENT_CLINIC_OR_DEPARTMENT_OTHER): Payer: Self-pay | Admitting: *Deleted

## 2017-03-20 NOTE — Progress Notes (Signed)
NPO AFTER MN.  ARRIVE AT 0530.  NEEDS URINE PREG.  GETTING LAB WORK DONE PRIOR TO DOS (CBCdiff, BMET).

## 2017-03-27 DIAGNOSIS — N920 Excessive and frequent menstruation with regular cycle: Secondary | ICD-10-CM | POA: Diagnosis not present

## 2017-03-27 DIAGNOSIS — Z79899 Other long term (current) drug therapy: Secondary | ICD-10-CM | POA: Diagnosis not present

## 2017-03-27 DIAGNOSIS — D500 Iron deficiency anemia secondary to blood loss (chronic): Secondary | ICD-10-CM | POA: Diagnosis not present

## 2017-03-27 DIAGNOSIS — D259 Leiomyoma of uterus, unspecified: Secondary | ICD-10-CM | POA: Diagnosis not present

## 2017-03-27 DIAGNOSIS — Z885 Allergy status to narcotic agent status: Secondary | ICD-10-CM | POA: Diagnosis not present

## 2017-03-27 DIAGNOSIS — F17210 Nicotine dependence, cigarettes, uncomplicated: Secondary | ICD-10-CM | POA: Diagnosis not present

## 2017-03-27 DIAGNOSIS — N840 Polyp of corpus uteri: Secondary | ICD-10-CM | POA: Diagnosis not present

## 2017-03-27 LAB — BASIC METABOLIC PANEL
ANION GAP: 9 (ref 5–15)
BUN: 9 mg/dL (ref 6–20)
CHLORIDE: 105 mmol/L (ref 101–111)
CO2: 24 mmol/L (ref 22–32)
Calcium: 9 mg/dL (ref 8.9–10.3)
Creatinine, Ser: 0.95 mg/dL (ref 0.44–1.00)
GFR calc non Af Amer: >60 mL/min (ref >60)
Glucose, Bld: 103 mg/dL — ABNORMAL HIGH (ref 65–99)
POTASSIUM: 3.8 mmol/L (ref 3.5–5.1)
Sodium: 138 mmol/L (ref 135–145)

## 2017-03-27 LAB — CBC WITH DIFFERENTIAL/PLATELET
Basophils Absolute: 0 10*3/uL (ref 0.0–0.1)
Basophils Relative: 0 %
Eosinophils Absolute: 0.3 10*3/uL (ref 0.0–0.7)
Eosinophils Relative: 3 %
HCT: 39.7 % (ref 36.0–46.0)
Hemoglobin: 13.1 g/dL (ref 12.0–15.0)
Lymphocytes Relative: 42 %
Lymphs Abs: 4.1 10*3/uL — ABNORMAL HIGH (ref 0.7–4.0)
MCH: 29 pg (ref 26.0–34.0)
MCHC: 33 g/dL (ref 30.0–36.0)
MCV: 87.8 fL (ref 78.0–100.0)
Monocytes Absolute: 0.8 10*3/uL (ref 0.1–1.0)
Monocytes Relative: 8 %
Neutro Abs: 4.7 10*3/uL (ref 1.7–7.7)
Neutrophils Relative %: 47 %
Platelets: 326 10*3/uL (ref 150–400)
RBC: 4.52 MIL/uL (ref 3.87–5.11)
RDW: 14.8 % (ref 11.5–15.5)
WBC: 9.9 10*3/uL (ref 4.0–10.5)

## 2017-03-30 ENCOUNTER — Ambulatory Visit (HOSPITAL_BASED_OUTPATIENT_CLINIC_OR_DEPARTMENT_OTHER): Payer: BC Managed Care – PPO | Admitting: Anesthesiology

## 2017-03-30 ENCOUNTER — Ambulatory Visit (HOSPITAL_BASED_OUTPATIENT_CLINIC_OR_DEPARTMENT_OTHER)
Admission: RE | Admit: 2017-03-30 | Discharge: 2017-03-30 | Disposition: A | Discharge status: Home / Self Care | Payer: BC Managed Care – PPO | Source: Ambulatory Visit | Attending: Family Medicine | Admitting: Family Medicine

## 2017-03-30 ENCOUNTER — Encounter (HOSPITAL_BASED_OUTPATIENT_CLINIC_OR_DEPARTMENT_OTHER): Payer: Self-pay | Admitting: *Deleted

## 2017-03-30 ENCOUNTER — Encounter (HOSPITAL_BASED_OUTPATIENT_CLINIC_OR_DEPARTMENT_OTHER)
Admission: RE | Disposition: A | Discharge status: Home / Self Care | Payer: Self-pay | Source: Ambulatory Visit | Attending: Family Medicine

## 2017-03-30 DIAGNOSIS — Z79899 Other long term (current) drug therapy: Secondary | ICD-10-CM | POA: Insufficient documentation

## 2017-03-30 DIAGNOSIS — D500 Iron deficiency anemia secondary to blood loss (chronic): Secondary | ICD-10-CM | POA: Insufficient documentation

## 2017-03-30 DIAGNOSIS — F17210 Nicotine dependence, cigarettes, uncomplicated: Secondary | ICD-10-CM | POA: Insufficient documentation

## 2017-03-30 DIAGNOSIS — N920 Excessive and frequent menstruation with regular cycle: Secondary | ICD-10-CM | POA: Insufficient documentation

## 2017-03-30 DIAGNOSIS — Z885 Allergy status to narcotic agent status: Secondary | ICD-10-CM | POA: Insufficient documentation

## 2017-03-30 DIAGNOSIS — N840 Polyp of corpus uteri: Secondary | ICD-10-CM | POA: Insufficient documentation

## 2017-03-30 DIAGNOSIS — D259 Leiomyoma of uterus, unspecified: Secondary | ICD-10-CM | POA: Insufficient documentation

## 2017-03-30 HISTORY — DX: Presence of spectacles and contact lenses: Z97.3

## 2017-03-30 HISTORY — DX: Excessive and frequent menstruation with regular cycle: N92.0

## 2017-03-30 HISTORY — DX: Iron deficiency anemia secondary to blood loss (chronic): D50.0

## 2017-03-30 HISTORY — PX: DILITATION & CURRETTAGE/HYSTROSCOPY WITH HYDROTHERMAL ABLATION: SHX5570

## 2017-03-30 HISTORY — DX: Leiomyoma of uterus, unspecified: D25.9

## 2017-03-30 LAB — POCT PREGNANCY, URINE: Preg Test, Ur: NEGATIVE

## 2017-03-30 SURGERY — DILATATION & CURETTAGE/HYSTEROSCOPY WITH HYDROTHERMAL ABLATION
Anesthesia: General | Site: Uterus

## 2017-03-30 MED ORDER — LACTATED RINGERS IV SOLN
INTRAVENOUS | Status: DC
  Administered 2017-03-30: 07:00:00 via INTRAVENOUS
  Filled 2017-03-30: qty 1000

## 2017-03-30 MED ORDER — FENTANYL CITRATE (PF) 100 MCG/2ML IJ SOLN
INTRAMUSCULAR | Status: AC
  Filled 2017-03-30: qty 2

## 2017-03-30 MED ORDER — FENTANYL CITRATE (PF) 100 MCG/2ML IJ SOLN
INTRAMUSCULAR | Status: DC | PRN
  Administered 2017-03-30 (×2): 25 ug via INTRAVENOUS
  Administered 2017-03-30: 50 ug via INTRAVENOUS

## 2017-03-30 MED ORDER — ONDANSETRON 4 MG PO TBDP
4.0000 mg | ORAL_TABLET | Freq: Once | ORAL | Status: AC
  Administered 2017-03-30: 4 mg via ORAL
  Filled 2017-03-30 (×2): qty 1

## 2017-03-30 MED ORDER — SODIUM CHLORIDE 0.9 % IR SOLN
Status: DC | PRN
Start: 2017-03-30 — End: 2017-03-30
  Administered 2017-03-30: 3000 mL

## 2017-03-30 MED ORDER — OXYCODONE HCL 5 MG PO TABS
5.0000 mg | ORAL_TABLET | Freq: Once | ORAL | Status: AC | PRN
  Administered 2017-03-30: 5 mg via ORAL
  Filled 2017-03-30: qty 1

## 2017-03-30 MED ORDER — KETOROLAC TROMETHAMINE 30 MG/ML IJ SOLN
INTRAMUSCULAR | Status: AC
  Filled 2017-03-30: qty 1

## 2017-03-30 MED ORDER — OXYCODONE HCL 5 MG/5ML PO SOLN
5.0000 mg | Freq: Once | ORAL | Status: AC | PRN
  Filled 2017-03-30: qty 5

## 2017-03-30 MED ORDER — ACETAMINOPHEN 10 MG/ML IV SOLN
INTRAVENOUS | Status: DC | PRN
  Administered 2017-03-30: 1000 mg via INTRAVENOUS

## 2017-03-30 MED ORDER — PROMETHAZINE HCL 25 MG/ML IJ SOLN
6.2500 mg | INTRAMUSCULAR | Status: DC | PRN
  Administered 2017-03-30: 6.25 mg via INTRAVENOUS
  Filled 2017-03-30: qty 1

## 2017-03-30 MED ORDER — LIDOCAINE 2% (20 MG/ML) 5 ML SYRINGE
INTRAMUSCULAR | Status: AC
  Filled 2017-03-30: qty 5

## 2017-03-30 MED ORDER — MIDAZOLAM HCL 2 MG/2ML IJ SOLN
INTRAMUSCULAR | Status: AC
  Filled 2017-03-30: qty 2

## 2017-03-30 MED ORDER — KETOROLAC TROMETHAMINE 30 MG/ML IJ SOLN
INTRAMUSCULAR | Status: DC | PRN
  Administered 2017-03-30: 30 mg via INTRAVENOUS

## 2017-03-30 MED ORDER — PROMETHAZINE HCL 25 MG/ML IJ SOLN
INTRAMUSCULAR | Status: AC
  Filled 2017-03-30: qty 1

## 2017-03-30 MED ORDER — LIDOCAINE 2% (20 MG/ML) 5 ML SYRINGE
INTRAMUSCULAR | Status: DC | PRN
  Administered 2017-03-30: 60 mg via INTRAVENOUS

## 2017-03-30 MED ORDER — OXYCODONE-ACETAMINOPHEN 5-325 MG PO TABS: 1.0000 | ORAL_TABLET | Freq: Four times a day (QID) | ORAL | 0 refills | Status: DC | PRN

## 2017-03-30 MED ORDER — DEXAMETHASONE SODIUM PHOSPHATE 10 MG/ML IJ SOLN
INTRAMUSCULAR | Status: DC | PRN
  Administered 2017-03-30: 10 mg via INTRAVENOUS

## 2017-03-30 MED ORDER — MIDAZOLAM HCL 2 MG/2ML IJ SOLN
INTRAMUSCULAR | Status: DC | PRN
  Administered 2017-03-30: 2 mg via INTRAVENOUS

## 2017-03-30 MED ORDER — LACTATED RINGERS IV SOLN
INTRAVENOUS | Status: DC
  Administered 2017-03-30: 1000 mL via INTRAVENOUS
  Administered 2017-03-30: 06:00:00 via INTRAVENOUS
  Filled 2017-03-30: qty 1000

## 2017-03-30 MED ORDER — FENTANYL CITRATE (PF) 100 MCG/2ML IJ SOLN
25.0000 ug | INTRAMUSCULAR | Status: DC | PRN
  Administered 2017-03-30 (×4): 25 ug via INTRAVENOUS
  Filled 2017-03-30: qty 1

## 2017-03-30 MED ORDER — PROPOFOL 10 MG/ML IV BOLUS
INTRAVENOUS | Status: DC | PRN
  Administered 2017-03-30: 200 mg via INTRAVENOUS

## 2017-03-30 MED ORDER — DEXAMETHASONE SODIUM PHOSPHATE 10 MG/ML IJ SOLN
INTRAMUSCULAR | Status: AC
  Filled 2017-03-30: qty 1

## 2017-03-30 MED ORDER — ACETAMINOPHEN 10 MG/ML IV SOLN
INTRAVENOUS | Status: AC
  Filled 2017-03-30: qty 100

## 2017-03-30 MED ORDER — PROPOFOL 10 MG/ML IV BOLUS
INTRAVENOUS | Status: AC
  Filled 2017-03-30: qty 40

## 2017-03-30 MED ORDER — LIDOCAINE HCL 1 % IJ SOLN
INTRAMUSCULAR | Status: DC | PRN
  Administered 2017-03-30: 10 mL

## 2017-03-30 MED ORDER — OXYCODONE HCL 5 MG PO TABS
ORAL_TABLET | ORAL | Status: AC
  Filled 2017-03-30: qty 1

## 2017-03-30 MED ORDER — ONDANSETRON HCL 4 MG/2ML IJ SOLN
INTRAMUSCULAR | Status: AC
  Filled 2017-03-30: qty 2

## 2017-03-30 MED ORDER — ONDANSETRON HCL 4 MG/2ML IJ SOLN
INTRAMUSCULAR | Status: DC | PRN
  Administered 2017-03-30: 4 mg via INTRAVENOUS

## 2017-03-30 SURGICAL SUPPLY — 11 items
CATH ROBINSON RED A/P 16FR (CATHETERS) ×3 IMPLANT
CLOTH BEACON ORANGE TIMEOUT ST (SAFETY) ×3 IMPLANT
DILATOR CANAL MILEX (MISCELLANEOUS) ×3 IMPLANT
GLOVE BIOGEL PI IND STRL 7.0 (GLOVE) ×2 IMPLANT
GLOVE BIOGEL PI INDICATOR 7.0 (GLOVE) ×4
GLOVE ECLIPSE 7.0 STRL STRAW (GLOVE) ×3 IMPLANT
GOWN STRL REUS W/TWL LRG LVL3 (GOWN DISPOSABLE) ×6 IMPLANT
PACK VAGINAL MINOR WOMEN LF (CUSTOM PROCEDURE TRAY) ×3 IMPLANT
PAD OB MATERNITY 4.3X12.25 (PERSONAL CARE ITEMS) ×3 IMPLANT
SET GENESYS HTA PROCERVA (MISCELLANEOUS) ×3 IMPLANT
TOWEL OR 17X24 6PK STRL BLUE (TOWEL DISPOSABLE) ×6 IMPLANT

## 2017-03-30 NOTE — Discharge Instructions (Signed)
Hysteroscopy, Care After °Refer to this sheet in the next few weeks. These instructions provide you with information on caring for yourself after your procedure. Your health care provider may also give you more specific instructions. Your treatment has been planned according to current medical practices, but problems sometimes occur. Call your health care provider if you have any problems or questions after your procedure. °What can I expect after the procedure? °After your procedure, it is typical to have the following: °· You may have some cramping. This normally lasts for a couple days. °· You may have bleeding. This can vary from light spotting for a few days to menstrual-like bleeding for 3-7 days. ° °Follow these instructions at home: °· Rest for the first 1-2 days after the procedure. °· Only take over-the-counter or prescription medicines as directed by your health care provider. Do not take aspirin. It can increase the chances of bleeding. °· Take showers instead of baths for 2 weeks or as directed by your health care provider. °· Do not drive for 24 hours or as directed. °· Do not drink alcohol while taking pain medicine. °· Do not use tampons, douche, or have sexual intercourse for 2 weeks or until your health care provider says it is okay. °· Take your temperature twice a day for 4-5 days. Write it down each time. °· Follow your health care provider's advice about diet, exercise, and lifting. °· If you develop constipation, you may: °? Take a mild laxative if your health care provider approves. °? Add bran foods to your diet. °? Drink enough fluids to keep your urine clear or pale yellow. °· Try to have someone with you or available to you for the first 24-48 hours, especially if you were given a general anesthetic. °· Follow up with your health care provider as directed. °Contact a health care provider if: °· You feel dizzy or lightheaded. °· You feel sick to your stomach (nauseous). °· You have  abnormal vaginal discharge. °· You have a rash. °· You have pain that is not controlled with medicine. °Get help right away if: °· You have bleeding that is heavier than a normal menstrual period. °· You have a fever. °· You have increasing cramps or pain, not controlled with medicine. °· You have new belly (abdominal) pain. °· You pass out. °· You have pain in the tops of your shoulders (shoulder strap areas). °· You have shortness of breath. °This information is not intended to replace advice given to you by your health care provider. Make sure you discuss any questions you have with your health care provider. °Document Released: 03/09/2013 Document Revised: 10/25/2015 Document Reviewed: 12/16/2012 °Elsevier Interactive Patient Education © 2017 [employer] ° ° °Post Anesthesia Home Care Instructions ° °Activity: °Get plenty of rest for the remainder of the day. A responsible individual must stay with you for 24 hours following the procedure.  °For the next 24 hours, DO NOT: °-Drive a car °-Operate machinery °-Drink alcoholic beverages °-Take any medication unless instructed by your physician °-Make any legal decisions or sign important papers. ° °Meals: °Start with liquid foods such as gelatin or soup. Progress to regular foods as tolerated. Avoid greasy, spicy, heavy foods. If nausea and/or vomiting occur, drink only clear liquids until the nausea and/or vomiting subsides. Call your physician if vomiting continues. ° °Special Instructions/Symptoms: °Your throat may feel dry or sore from the anesthesia or the breathing tube placed in your throat during surgery. If this causes discomfort, gargle   with warm salt water. The discomfort should disappear within 24 hours. ° °If you had a scopolamine patch placed behind your ear for the management of post- operative nausea and/or vomiting: ° °1. The medication in the patch is effective for 72 hours, after which it should be removed.  Wrap patch in a tissue and discard in  the trash. Wash hands thoroughly with soap and water. °2. You may remove the patch earlier than 72 hours if you experience unpleasant side effects which may include dry mouth, dizziness or visual disturbances. °3. Avoid touching the patch. Wash your hands with soap and water after contact with the patch. °  ° ° °

## 2017-03-30 NOTE — Anesthesia Procedure Notes (Signed)
Procedure Name: LMA Insertion Date/Time: 03/30/2017 7:44 AM Performed by: Wanita Chamberlain Pre-anesthesia Checklist: Patient identified, Timeout performed, Emergency Drugs available, Suction available and Patient being monitored Patient Re-evaluated:Patient Re-evaluated prior to induction Oxygen Delivery Method: Circle system utilized Preoxygenation: Pre-oxygenation with 100% oxygen Induction Type: IV induction Ventilation: Mask ventilation without difficulty LMA: LMA inserted LMA Size: 4.0 Number of attempts: 1 Airway Equipment and Method: Bite block (gauze) Placement Confirmation: positive ETCO2,  CO2 detector and breath sounds checked- equal and bilateral Tube secured with: Tape Dental Injury: Teeth and Oropharynx as per pre-operative assessment

## 2017-03-30 NOTE — Anesthesia Postprocedure Evaluation (Signed)
Anesthesia Post Note  Patient: Jean Ferguson  Procedure(s) Performed: DILATATION & CURETTAGE/HYSTEROSCOPY WITH HYDROTHERMAL ABLATION (N/A Uterus)     Patient location during evaluation: PACU Anesthesia Type: General Level of consciousness: awake and alert Pain management: pain level controlled Vital Signs Assessment: post-procedure vital signs reviewed and stable Respiratory status: spontaneous breathing, nonlabored ventilation, respiratory function stable and patient connected to nasal cannula oxygen Cardiovascular status: blood pressure returned to baseline and stable Postop Assessment: no apparent nausea or vomiting Anesthetic complications: no    Last Vitals:  Vitals:   03/30/17 0914 03/30/17 0915  BP:  (!) 148/86  Pulse: 75 66  Resp: 13 14  Temp:    SpO2: 100% 100%    Last Pain:  Vitals:   03/30/17 0926  TempSrc:   PainSc: 7                  Zara Wendt S

## 2017-03-30 NOTE — Interval H&P Note (Signed)
History and Physical Interval Note:  03/30/2017 7:16 AM  Jean Ferguson  has presented today for surgery, with the diagnosis of DUB  The various methods of treatment have been discussed with the patient and family. After consideration of risks, benefits and other options for treatment, the patient has consented to  Procedure(s): DILATATION & CURETTAGE/HYSTEROSCOPY WITH HYDROTHERMAL ABLATION (N/A) as a surgical intervention .  The patient's history has been reviewed, patient examined, no change in status, stable for surgery.  I have reviewed the patient's chart and labs.  Questions were answered to the patient's satisfaction.     Jean Ferguson

## 2017-03-30 NOTE — Transfer of Care (Signed)
Immediate Anesthesia Transfer of Care Note  Patient: Jean Ferguson  Procedure(s) Performed: DILATATION & CURETTAGE/HYSTEROSCOPY WITH HYDROTHERMAL ABLATION (N/A Uterus)  Patient Location: PACU  Anesthesia Type:General  Level of Consciousness: awake, alert , oriented and patient cooperative  Airway & Oxygen Therapy: Patient Spontanous Breathing and Patient connected to nasal cannula oxygen  Post-op Assessment: Report given to RN and Post -op Vital signs reviewed and stable  Post vital signs: Reviewed and stable  Last Vitals:  Vitals:   03/30/17 0542  BP: 128/77  Pulse: 78  Resp: 16  Temp: 37.1 C  SpO2: 100%    Last Pain:  Vitals:   03/30/17 0542  TempSrc: Oral      Patients Stated Pain Goal: 8 (79/55/83 1674)  Complications: No apparent anesthesia complications

## 2017-03-30 NOTE — Op Note (Signed)
Preoperative diagnosis: Menorrhagia, fibroid uterus  Postoperative diagnosis: Menorrhagia  Procedure: HTA endometrial ablation, hysteroscopy, D & C  Surgeon: Standley Dakins. Kennon Rounds, M.D.  Anesthesia: general Myrtie Soman, MD  Findings: Normal appearing cervix and uterine cavity with both ostia seen.  Estimated blood loss: Minimal  Specimens: Endometrial curettings  Disposition of specimen: Pathology  Reason for procedure: Patient is a 41 y.o. [id number] with menorrhagia and fibroids and prior C-section x 3 with BTL.   Procedure: Patient was taken to the OR where she was placed in dorsal lithotomy in [place]. SCDs were in place. An adequate timeout was obtained. The patient was prepped and draped in the usual sterile fashion. A red rubber catheter is used to drain her bladder. A speculum was placed inside the vagina and the cervix visualized. The cervix was grasped anteriorly with a single-tooth tenaculum. 20 cc of quarter percent Marcaine were injected for paracervical block. The uterus sounded to 10 cm. Sequential dilation was done to a #23 dilator, and the HTA with hysteroscope was introduced into the uterine cavity. The cervix and endometrial lining appeared normal both ostia were seen there was a posterior polypoid deformity of the cavity. A seal test was done and was passed. The uterine cavity was heated to between 80 and 90C and HTA was performed for 10 minutes. Cooling was then performed and the instrument removed. Sharp curettage was then performed and sample sent to pathology. Repeat hysteroscopy revealed polypoid mass was removed. All instrument, needle, and lap counts were correct x 2. The patient was awakened taken to recovery room in stable condition.  Donnamae Jude MD 03/30/2017 8:29 AM

## 2017-03-30 NOTE — Anesthesia Preprocedure Evaluation (Addendum)
Anesthesia Evaluation  Patient identified by MRN, date of birth, ID band Patient awake    Reviewed: Allergy & Precautions, NPO status , Patient's Chart, lab work & pertinent test results  Airway Mallampati: II  TM Distance: >3 FB Neck ROM: Full    Dental no notable dental hx. (+) Poor Dentition, Chipped,    Pulmonary Current Smoker,    Pulmonary exam normal breath sounds clear to auscultation       Cardiovascular negative cardio ROS Normal cardiovascular exam Rhythm:Regular Rate:Normal     Neuro/Psych negative neurological ROS  negative psych ROS   GI/Hepatic negative GI ROS, Neg liver ROS,   Endo/Other  negative endocrine ROS  Renal/GU negative Renal ROS  negative genitourinary   Musculoskeletal negative musculoskeletal ROS (+)   Abdominal   Peds negative pediatric ROS (+)  Hematology negative hematology ROS (+)   Anesthesia Other Findings   Reproductive/Obstetrics negative OB ROS                            Anesthesia Physical Anesthesia Plan  ASA: II  Anesthesia Plan: General   Post-op Pain Management:    Induction: Intravenous  PONV Risk Score and Plan: 2 and Ondansetron, Dexamethasone and Treatment may vary due to age or medical condition  Airway Management Planned: LMA  Additional Equipment:   Intra-op Plan:   Post-operative Plan: Extubation in OR  Informed Consent: I have reviewed the patients History and Physical, chart, labs and discussed the procedure including the risks, benefits and alternatives for the proposed anesthesia with the patient or authorized representative who has indicated his/her understanding and acceptance.   Dental advisory given  Plan Discussed with: CRNA and Surgeon  Anesthesia Plan Comments:         Anesthesia Quick Evaluation

## 2017-03-31 ENCOUNTER — Encounter (HOSPITAL_BASED_OUTPATIENT_CLINIC_OR_DEPARTMENT_OTHER): Payer: Self-pay | Admitting: Family Medicine

## 2017-04-16 ENCOUNTER — Ambulatory Visit: Payer: BC Managed Care – PPO | Admitting: Family Medicine

## 2017-04-16 ENCOUNTER — Encounter: Payer: Self-pay | Admitting: Family Medicine

## 2017-04-16 VITALS — BP 133/83 | HR 83 | Ht 63.0 in | Wt 190.6 lb

## 2017-04-16 DIAGNOSIS — Z09 Encounter for follow-up examination after completed treatment for conditions other than malignant neoplasm: Secondary | ICD-10-CM

## 2017-04-16 DIAGNOSIS — R3129 Other microscopic hematuria: Secondary | ICD-10-CM

## 2017-04-16 DIAGNOSIS — Z9889 Other specified postprocedural states: Secondary | ICD-10-CM | POA: Diagnosis not present

## 2017-04-16 LAB — POCT URINALYSIS DIP (DEVICE)
Bilirubin Urine: NEGATIVE
GLUCOSE, UA: NEGATIVE mg/dL
KETONES UR: NEGATIVE mg/dL
LEUKOCYTES UA: NEGATIVE
Nitrite: NEGATIVE
Protein, ur: NEGATIVE mg/dL
Urobilinogen, UA: 0.2 mg/dL (ref 0.0–1.0)
pH: 5.5 (ref 5.0–8.0)

## 2017-04-16 NOTE — Progress Notes (Signed)
   Subjective:    Patient ID: Jean Ferguson is a 41 y.o. female presenting with Post-op Follow-up  on 04/16/2017  HPI: Here for post op check from endometrial ablation 03/30/17. Pathology reviewed, showed endometrial polyp. Notes some watery thin discharge that is pink tinged. States she cannot control her urine. She is concerned that she is peeing on herself all the time. She is no longer having vaginal bleeding, but leaking clear fluid/?urine like her vaginal bleeding was.  Review of Systems  Constitutional: Negative for chills and fever.  Respiratory: Negative for shortness of breath.   Cardiovascular: Negative for chest pain.  Gastrointestinal: Negative for abdominal pain, nausea and vomiting.  Genitourinary: Negative for dysuria.  Skin: Negative for rash.      Objective:    BP 133/83   Pulse 83   Ht 5\' 3"  (1.6 m)   Wt 190 lb 9.6 oz (86.5 kg)   LMP  (LMP Unknown) Comment: per pt has bleed since last year 2017 so unsure   BMI 33.76 kg/m  Physical Exam  Constitutional: She is oriented to person, place, and time. She appears well-developed and well-nourished. No distress.  HENT:  Head: Normocephalic and atraumatic.  Eyes: No scleral icterus.  Neck: Neck supple.  Cardiovascular: Normal rate.  Pulmonary/Chest: Effort normal.  Abdominal: Soft.  Neurological: She is alert and oriented to person, place, and time.  Skin: Skin is warm and dry.  Psychiatric: She has a normal mood and affect.   Urinalysis    Component Value Date/Time   COLORURINE YELLOW 01/05/2016 0800   APPEARANCEUR CLEAR 01/05/2016 0800   LABSPEC >=1.030 04/16/2017 1415   PHURINE 5.5 04/16/2017 1415   GLUCOSEU NEGATIVE 04/16/2017 1415   HGBUR MODERATE (A) 04/16/2017 1415   BILIRUBINUR NEGATIVE 04/16/2017 1415   KETONESUR NEGATIVE 04/16/2017 1415   PROTEINUR NEGATIVE 04/16/2017 1415   UROBILINOGEN 0.2 04/16/2017 1415   NITRITE NEGATIVE 04/16/2017 1415   LEUKOCYTESUR NEGATIVE 04/16/2017 1415          Assessment & Plan:   Other microscopic hematuria - suspect this is vaginal--will check urine culture and treat appropriately. - Plan: Urine Culture, Urine Culture  Postop check - nml course post endometrial ablation--usually lasts 3 wks--rtn to see [place] if not improved.  Return if symptoms worsen or fail to improve.  Donnamae Jude 04/16/2017 2:09 PM

## 2017-04-18 LAB — URINE CULTURE: ORGANISM ID, BACTERIA: NO GROWTH

## 2018-01-26 ENCOUNTER — Other Ambulatory Visit: Payer: Self-pay

## 2018-01-26 ENCOUNTER — Emergency Department (HOSPITAL_COMMUNITY): Payer: BC Managed Care – PPO

## 2018-01-26 ENCOUNTER — Encounter (HOSPITAL_COMMUNITY): Payer: Self-pay | Admitting: Emergency Medicine

## 2018-01-26 ENCOUNTER — Emergency Department (HOSPITAL_COMMUNITY)
Admission: EM | Admit: 2018-01-26 | Discharge: 2018-01-26 | Disposition: A | Discharge status: Home / Self Care | Payer: BC Managed Care – PPO | Attending: Emergency Medicine | Admitting: Emergency Medicine

## 2018-01-26 DIAGNOSIS — Z79899 Other long term (current) drug therapy: Secondary | ICD-10-CM | POA: Insufficient documentation

## 2018-01-26 DIAGNOSIS — F17210 Nicotine dependence, cigarettes, uncomplicated: Secondary | ICD-10-CM | POA: Insufficient documentation

## 2018-01-26 DIAGNOSIS — R079 Chest pain, unspecified: Secondary | ICD-10-CM | POA: Diagnosis present

## 2018-01-26 DIAGNOSIS — R072 Precordial pain: Secondary | ICD-10-CM | POA: Insufficient documentation

## 2018-01-26 LAB — CBC
HEMATOCRIT: 43.6 % (ref 36.0–46.0)
Hemoglobin: 13.8 g/dL (ref 12.0–15.0)
MCH: 29.3 pg (ref 26.0–34.0)
MCHC: 31.7 g/dL (ref 30.0–36.0)
MCV: 92.6 fL (ref 78.0–100.0)
Platelets: 239 10*3/uL (ref 150–400)
RBC: 4.71 MIL/uL (ref 3.87–5.11)
RDW: 13.9 % (ref 11.5–15.5)
WBC: 6.7 10*3/uL (ref 4.0–10.5)

## 2018-01-26 LAB — BASIC METABOLIC PANEL
ANION GAP: 8 (ref 5–15)
BUN: 12 mg/dL (ref 6–20)
CHLORIDE: 104 mmol/L (ref 98–111)
CO2: 27 mmol/L (ref 22–32)
Calcium: 8.7 mg/dL — ABNORMAL LOW (ref 8.9–10.3)
Creatinine, Ser: 1.03 mg/dL — ABNORMAL HIGH (ref 0.44–1.00)
GFR calc non Af Amer: >60 mL/min (ref >60)
Glucose, Bld: 91 mg/dL (ref 70–99)
Potassium: 4.2 mmol/L (ref 3.5–5.1)
SODIUM: 139 mmol/L (ref 135–145)

## 2018-01-26 LAB — I-STAT TROPONIN, ED: Troponin i, poc: 0.01 ng/mL (ref 0.00–0.08)

## 2018-01-26 LAB — I-STAT BETA HCG BLOOD, ED (MC, WL, AP ONLY): I-stat hCG, quantitative: <5 m[IU]/mL (ref <5)

## 2018-01-26 MED ORDER — FAMOTIDINE 20 MG PO TABS: 20.0000 mg | ORAL_TABLET | Freq: Two times a day (BID) | ORAL | 0 refills | Status: DC

## 2018-01-26 NOTE — ED Triage Notes (Signed)
Pt arrives by pov for chest pain that began 2 weeks and has been intermittent. Pt states last night while lying in bed after eating her pt got worse, pt describes a "sharp little knives in the center of her chest". Pt thought this was related to "heartburn" but not relieved after trying to belch. Pt went to work this am and told her co workers who suggested she come to ED .

## 2018-01-26 NOTE — Discharge Instructions (Signed)
Please read and follow all provided instructions.  Your diagnoses today include:  1. Precordial pain     Tests performed today include:  An EKG of your heart  A chest x-ray  Cardiac enzymes - a blood test for heart muscle damage  Blood counts and electrolytes  Vital signs. See below for your results today.   Medications prescribed:   Pepcid (famotidine) - antihistamine  You can find this medication over-the-counter.   DO NOT exceed:   20mg  Pepcid every 12 hours  Take any prescribed medications only as directed.  Follow-up instructions: Please follow-up with your primary care provider or the cardiologist listed for further evaluation of your symptoms.   Return instructions:  SEEK IMMEDIATE MEDICAL ATTENTION IF:  You have severe chest pain, especially if the pain is crushing or pressure-like and spreads to the arms, back, neck, or jaw, or if you have sweating, nausea (feeling sick to your stomach), or shortness of breath. THIS IS AN EMERGENCY. Don't wait to see if the pain will go away. Get medical help at once. Call 911 or 0 (operator). DO NOT drive yourself to the hospital.   Your chest pain gets worse and does not go away with rest.   You have an attack of chest pain lasting longer than usual, despite rest and treatment with the medications your caregiver has prescribed.   You wake from sleep with chest pain or shortness of breath.  You feel dizzy or faint.  You have chest pain not typical of your usual pain for which you originally saw your caregiver.   You have any other emergent concerns regarding your health.  Additional Information: Chest pain comes from many different causes. Your caregiver has diagnosed you as having chest pain that is not specific for one problem, but does not require admission.  You are at low risk for an acute heart condition or other serious illness.   Your vital signs today were: BP (!) 151/99    Pulse (!) 56    Temp 98.8 F (37.1  C) (Oral)    Resp 17    LMP 03/28/2017 Comment: ablation   SpO2 99%  If your blood pressure (BP) was elevated above 135/85 this visit, please have this repeated by your doctor within one month. --------------

## 2018-01-26 NOTE — ED Provider Notes (Signed)
[facility] EMERGENCY DEPARTMENT Provider Note   CSN: [id number] Arrival date & time: 01/26/18  9528     History   Chief Complaint Chief Complaint  Patient presents with  . Chest Pain    HPI Jean Ferguson is a 42 y.o. female.  Patient presents with complaint of chest pain over the past 2 weeks.  Pain is been every day but intermittent.  Patient describes getting an acute onset of a sharp chest pain in the mid chest.  Symptoms last for about 2 minutes before resolving.  She does not have any associated shortness of breath, nausea, vomiting, diaphoresis.  Symptoms do not occur with exertion.  They do not seem to be related to food.  She reports trying to make herself burp, however this did not help her feel better.  No lightheadedness or syncope.  No reported heartburn symptoms or sour taste in the mouth in the morning.  She denies any stool changes.  Patient's father had a heart attack and bypass in his early 55s. + smoker.  No diagnosed hypertension, diabetes, cholesterol.  Patient denies risk factors for pulmonary embolism including: unilateral leg swelling, history of DVT/PE/other blood clots, use of exogenous hormones, recent immobilizations, recent surgery, recent travel (>4hr segment), malignancy, hemoptysis.       Past Medical History:  Diagnosis Date  . Anemia due to blood loss, chronic   . Menorrhagia   . Uterine fibroid   . Wears glasses     Patient Active Problem List   Diagnosis Date Noted  . 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 Visual disturbance 02/25/2016  . Headache 02/25/2016  . 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 Menorrhagia 02/06/2016  . Fibroid uterus 01/27/2016    Past Surgical History:  Procedure Laterality Date  . CESAREAN SECTION  x3   last one 1997   Bilateral Tubal Ligation w/ last one  . DILITATION & CURRETTAGE/HYSTROSCOPY WITH HYDROTHERMAL ABLATION N/A 03/30/2017   Procedure: DILATATION & CURETTAGE/HYSTEROSCOPY WITH  HYDROTHERMAL ABLATION;  Surgeon: Donnamae Jude, MD;  Location: [facility];  Service: Gynecology;  Laterality: N/A;     OB History    Gravida  3   Para  3   Term      Preterm  3   AB      Living  3     SAB      TAB      Ectopic      Multiple      Live Births  3            Home Medications    Prior to Admission medications   Medication Sig Start Date End Date Taking? Authorizing Provider  acetaminophen (TYLENOL) 500 MG tablet Take 500 mg by mouth every 6 (six) hours as needed for moderate pain.    [provider]  cholecalciferol (VITAMIN D) 1000 units tablet Take 1,000 Units by mouth daily.    [provider]  ferrous sulfate (FERROUSUL) 325 (65 FE) MG tablet Take 1 tablet (325 mg total) by mouth 2 (two) times daily. Patient not taking: Reported on 04/16/2017 02/25/17   Donnamae Jude, MD  ibuprofen (ADVIL,MOTRIN) 800 MG tablet Take 1 tablet (800 mg total) by mouth every 6 (six) hours as needed. Patient not taking: Reported on 04/16/2017 02/25/17   Donnamae Jude, MD  oxyCODONE-acetaminophen (PERCOCET/ROXICET) 5-325 MG tablet Take 1-2 tablets by mouth every 6 (six) hours as needed. Patient not taking: Reported on  04/16/2017 03/30/17   Donnamae Jude, MD    Family History Family History  Problem Relation Age of Onset  . Diabetes Mother   . Lung cancer Father   . Diabetes Father   . Heart attack Paternal Grandfather   . Stroke Paternal Uncle   . Diabetes Paternal Uncle   . Heart attack Paternal Uncle        x 3 brothers  . Stroke Paternal Aunt   . Diabetes Paternal Aunt   . Heart attack Paternal Aunt     Social History Social History   Tobacco Use  . Smoking status: Current Every Day Smoker    Packs/day: 0.25    Years: 15.00    Pack years: 3.75    Types: Cigarettes  . Smokeless tobacco: Never Used  . Tobacco comment: 6 cig per day  Substance Use Topics  . Alcohol use: Yes    Comment: occasional  . Drug use: No       Allergies   Morphine and related   Review of Systems Review of Systems  Constitutional: Negative for diaphoresis and fever.  Eyes: Negative for redness.  Respiratory: Negative for cough and shortness of breath.   Cardiovascular: Positive for chest pain. Negative for palpitations and leg swelling.  Gastrointestinal: Negative for abdominal pain, nausea and vomiting.  Genitourinary: Negative for dysuria.  Musculoskeletal: Negative for back pain and neck pain.  Skin: Negative for rash.  Neurological: Negative for syncope and light-headedness.  Psychiatric/Behavioral: The patient is not nervous/anxious.      Physical Exam Updated Vital Signs BP (!) 165/97   Pulse 66   Temp 98.8 F (37.1 C) (Oral)   Resp 13   LMP 03/28/2017 Comment: ablation  SpO2 100%   Physical Exam  Constitutional: She appears well-developed and well-nourished.  HENT:  Head: Normocephalic and atraumatic.  Mouth/Throat: Mucous membranes are normal. Mucous membranes are not dry.  Eyes: Conjunctivae are normal.  Neck: Trachea normal and normal range of motion. Neck supple. Normal carotid pulses and no JVD present. No muscular tenderness present. Carotid bruit is not present. No tracheal deviation present.  Cardiovascular: Normal rate, regular rhythm, S1 normal, S2 normal, normal heart sounds and intact distal pulses. Exam reveals no decreased pulses.  No murmur heard. Pulmonary/Chest: Effort normal. No respiratory distress. She has no wheezes. She exhibits no tenderness.  Abdominal: Soft. Normal aorta and bowel sounds are normal. There is no tenderness. There is no rebound and no guarding.  Musculoskeletal: Normal range of motion.  Neurological: She is alert.  Skin: Skin is warm and dry. She is not diaphoretic. No cyanosis. No pallor.  Psychiatric: She has a normal mood and affect.  Nursing note and vitals reviewed.    ED Treatments / Results  Labs (all labs ordered are listed, but only abnormal  results are displayed) Labs Reviewed  BASIC METABOLIC PANEL - Abnormal; Notable for the following components:      Result Value   Creatinine, Ser 1.03 (*)    Calcium 8.7 (*)    All other components within normal limits  CBC  I-STAT TROPONIN, ED  I-STAT BETA HCG BLOOD, ED ([facility], [facility], [facility] ONLY)    EKG EKG Interpretation  Date/Time:  Tuesday January 26 2018 08:44:24 EDT Ventricular Rate:  81 PR Interval:  150 QRS Duration: 72 QT Interval:  394 QTC Calculation: 457 R Axis:   20 Text Interpretation:  Normal sinus rhythm Cannot rule out Anterior infarct , age undetermined Abnormal ECG no prior ECG  for comaprison.  No STEMI Confirmed by Antony Blackbird [phone]) on 01/26/2018 11:13:12 AM   Radiology Dg Chest 2 View  Result Date: 01/26/2018 CLINICAL DATA:  Chest pain EXAM: CHEST - 2 VIEW COMPARISON:  None. FINDINGS: The heart size and mediastinal contours are within normal limits. Both lungs are clear. The visualized skeletal structures are unremarkable. IMPRESSION: No active cardiopulmonary disease. Electronically Signed   By: Franchot Gallo M.D.   On: 01/26/2018 09:39    Procedures Procedures (including critical care time)  Medications Ordered in ED Medications - No data to display   Initial Impression / Assessment and Plan / ED Course  I have reviewed the triage vital signs and the nursing notes.  Pertinent labs & imaging results that were available during my care of the patient were reviewed by me and considered in my medical decision making (see chart for details).     Patient seen and examined. Work-up reassuring.   Vital signs reviewed and are as follows: BP (!) 151/99   Pulse (!) 56   Temp 98.8 F (37.1 C) (Oral)   Resp 17   LMP 03/28/2017 Comment: ablation  SpO2 99%   Plan: empiric Pepcid, PCP/cardiology follow-up given risk factor profile.   Patient was counseled to return with severe chest pain, especially if the pain is crushing or pressure-like and spreads to the  arms, back, neck, or jaw, or if they have sweating, nausea, or shortness of breath with the pain. They were encouraged to call 911 with these symptoms.   They were also told to return if their chest pain gets worse and does not go away with rest, they have an attack of chest pain lasting longer than usual despite rest and treatment with the medications their caregiver has prescribed, if they wake from sleep with chest pain or shortness of breath, if they feel dizzy or faint, if they have chest pain not typical of their usual pain, or if they have any other emergent concerns regarding their health.  The patient verbalized understanding and agreed.    Final Clinical Impressions(s) / ED Diagnoses   Final diagnoses:  Precordial pain   Patient with atypical, short-lived chest pains.  Last episode was last night.  Troponin x1 is negative.  EKG without signs of ischemia.  Overall her story is atypical.  Low risk heart score.  Patient is PERC negative and does not have signs and symptoms suggestive of DVT/PE.  Patient is asymptomatic currently.  She does have a strong family history.  Recommend PCP/cardiology follow-up for risk factor evaluation and modification as needed.  Referral given.   ED Discharge Orders         Ordered    famotidine (PEPCID) 20 MG tablet  2 times daily     01/26/18 1110           Carlisle Cater, [place] 01/26/18 1114    Tegeler, Gwenyth Allegra, MD 01/26/18 1620

## 2018-01-29 ENCOUNTER — Ambulatory Visit (INDEPENDENT_AMBULATORY_CARE_PROVIDER_SITE_OTHER): Payer: BC Managed Care – PPO | Admitting: Cardiovascular Disease

## 2018-01-29 ENCOUNTER — Encounter: Payer: Self-pay | Admitting: Nurse Practitioner

## 2018-01-29 ENCOUNTER — Encounter: Payer: Self-pay | Admitting: Cardiovascular Disease

## 2018-01-29 VITALS — BP 124/80 | HR 70 | Ht 63.0 in | Wt 180.1 lb

## 2018-01-29 DIAGNOSIS — R0789 Other chest pain: Secondary | ICD-10-CM

## 2018-01-29 MED ORDER — OMEPRAZOLE 20 MG PO CPDR: 20.0000 mg | DELAYED_RELEASE_CAPSULE | Freq: Every day | ORAL | 0 refills | Status: DC

## 2018-01-29 NOTE — Patient Instructions (Signed)
Medication Instructions:  Your physician has recommended you make the following change in your medication:  START Omeprazole (Prilosec) 20 mg once daily - take in the morning 20 minutes before food   Labwork: None Ordered   Testing/Procedures: None Ordered   Follow-Up: Your physician recommends that you schedule a follow-up appointment in: as needed with Dr. Acie Fredrickson   If you need a refill on your cardiac medications before your next appointment, please call your pharmacy.   Thank you for choosing [facility]! Christen Bame, RN [phone]

## 2018-01-29 NOTE — Progress Notes (Signed)
Cardiology Office Note:    Date:  01/29/2018   ID:  Jean Ferguson, DOB 1975-06-06, MRN [id number]  PCP:  Micheline Chapman, NP  Cardiologist:  Mertie Moores, MD   Referring MD: Micheline Chapman, NP   Chief Complaint  Patient presents with  . Chest Pain     History of Present Illness:    Jean Ferguson is a 42 y.o. female with a hx of chest pain.  We are asked to see her today for further evaluation.  Patient has had chest pains intermittently for the past 2 to 3 weeks.  She presented to the emergency room on August 27.  CP for the past 2-3 weeks.   Nothing makes it better or worse Not worsened with twisiting her torso,  Not related to earting or drinking  Not worsened with exercise Lasts for 3 minutes. This past Monday night, lasted for 20 minutes.  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 Anemia due to blood loss, chronic   . Menorrhagia   . Uterine fibroid   . Wears glasses     Past Surgical History:  Procedure Laterality Date  . CESAREAN SECTION  x3   last one 1997   Bilateral Tubal Ligation w/ last one  . DILITATION & CURRETTAGE/HYSTROSCOPY WITH HYDROTHERMAL ABLATION N/A 03/30/2017   Procedure: DILATATION & CURETTAGE/HYSTEROSCOPY WITH HYDROTHERMAL ABLATION;  Surgeon: Donnamae Jude, MD;  Location: [facility];  Service: Gynecology;  Laterality: N/A;    Current Medications: Current Meds  Medication Sig  . acetaminophen (TYLENOL) 500 MG tablet Take 500 mg by mouth every 6 (six) hours as needed for moderate pain.  . cholecalciferol (VITAMIN D) 1000 units tablet Take 1,000 Units by mouth daily.  . famotidine (PEPCID) 20 MG tablet Take 1 tablet (20 mg total) by mouth 2 (two) times daily.     Allergies:   Morphine and  related   Social History   Socioeconomic History  . Marital status: Single    Spouse name: Not on file  . Number of children: 3  . Years of education: 10th  . Highest education level: Not on file  Occupational History  . Occupation: Engineer, maintenance (IT)  Social Needs  . Financial resource strain: Not on file  . Food insecurity:    Worry: Not on file    Inability: Not on file  . Transportation needs:    Medical: Not on file    Non-medical: Not on file  Tobacco Use  . Smoking status: Current Every Day Smoker    Packs/day: 0.25    Years: 15.00    Pack years: 3.75    Types: Cigarettes  . Smokeless tobacco: Never Used  . Tobacco comment: 6 cig per day  Substance and Sexual Activity  . Alcohol use: Yes    Comment: occasional  . Drug use: No  . Sexual activity: Yes    Birth control/protection: Surgical  Lifestyle  . Physical activity:    Days per week: Not on file    Minutes per session: Not on file  . Stress: Not on file  Relationships  . Social connections:    Talks on phone: Not on file  Gets together: Not on file    Attends religious service: Not on file    Active member of club or organization: Not on file    Attends meetings of clubs or organizations: Not on file    Relationship status: Not on file  Other Topics Concern  . Not on file  Social History Narrative   Right-handed.   Lives at home with children.   Occasional caffeine use.     Family History: The patient's family history includes Diabetes in her father, mother, paternal aunt, and paternal uncle; Heart attack in her paternal aunt, paternal grandfather, and paternal uncle; Lung cancer in her father; Stroke in her paternal aunt and paternal uncle.  ROS:   Please see the history of present illness.     All other systems reviewed and are negative.  EKGs/Labs/Other Studies Reviewed:    The following studies were reviewed today:   EKG: January 29, 2018: Normal sinus rhythm at 68.  EKG is normal.  Recent  Labs: 01/26/2018: BUN 12; Creatinine, Ser 1.03; Hemoglobin 13.8; Platelets 239; Potassium 4.2; Sodium 139  Recent Lipid Panel    Component Value Date/Time   CHOL 108 (L) 02/29/2016 0821   TRIG 44 02/29/2016 0821   HDL 36 (L) 02/29/2016 0821   CHOLHDL 3.0 02/29/2016 0821   VLDL 9 02/29/2016 0821   LDLCALC 63 02/29/2016 0821    Physical Exam:    VS:  BP 124/80   Pulse 70   Ht 5\' 3"  (1.6 m)   Wt 180 lb 1.9 oz (81.7 kg)   SpO2 96%   BMI 31.91 kg/m     Wt Readings from Last 3 Encounters:  01/29/18 180 lb 1.9 oz (81.7 kg)  04/16/17 190 lb 9.6 oz (86.5 kg)  03/30/17 188 lb (85.3 kg)     GEN:  Well nourished, well developed in no acute distress HEENT: Normal NECK: No JVD; No carotid bruits LYMPHATICS: No lymphadenopathy CARDIAC: RR, no chest wall tenderness.  RESPIRATORY:  Clear to auscultation without rales, wheezing or rhonchi  ABDOMEN: Soft, non-tender, non-distended MUSCULOSKELETAL:  No edema; No deformity  SKIN: Warm and dry NEUROLOGIC:  Alert and oriented x 3 PSYCHIATRIC:  Normal affect   ASSESSMENT:    1. Atypical chest pain    PLAN:    In order of problems listed above:  1. 1.  Atypical chest pain: Jean Ferguson presents with atypical chest pain.  Described as a burning aching-like sensation.  Last for several minutes.  It is not related to twisting or turning her torso, not related to eating or drinking, not related to exercise.  I suspect that her symptoms are related to gastroesophageal reflux or perhaps hiatal hernia.  We will recommend that she take omeprazole 20 mg a day for several weeks.  I have advised her to continue to work on her diet.  I have advised her to start an exercise program.  I suggested possibly do a treadmill test but she wants to hold off on this for now.  I advised her to call us if she has any worsening or continuous episodes of chest pain that does not respond to the omeprazole.  Have a family history of cardiac disease but her father also  smoked heavily.  Her lipids back in 2017 were fairly normal.  Her total cholesterol is 108.  HDL is 36.  LDL 63.  Triglycerides are 44.  She smokes 3 to 4 cigarettes a day.  I advised her to stop.   Medication  Adjustments/Labs and Tests Ordered: Current medicines are reviewed at length with the patient today.  Concerns regarding medicines are outlined above.  Orders Placed This Encounter  Procedures  . EKG 12-Lead   Meds ordered this encounter  Medications  . omeprazole (PRILOSEC) 20 MG capsule    Sig: Take 1 capsule (20 mg total) by mouth daily.    Dispense:  20 capsule    Refill:  0    Patient Instructions  Medication Instructions:  Your physician has recommended you make the following change in your medication:  START Omeprazole (Prilosec) 20 mg once daily - take in the morning 20 minutes before food   Labwork: None Ordered   Testing/Procedures: None Ordered   Follow-Up: Your physician recommends that you schedule a follow-up appointment in: as needed with Dr. Acie Fredrickson   If you need a refill on your cardiac medications before your next appointment, please call your pharmacy.   Thank you for choosing [facility]! Christen Bame, RN [phone]       Signed, Mertie Moores, MD  01/29/2018 9:48 AM    [place]

## 2018-03-17 ENCOUNTER — Other Ambulatory Visit: Payer: Self-pay

## 2018-03-17 ENCOUNTER — Emergency Department (HOSPITAL_COMMUNITY): Payer: BC Managed Care – PPO

## 2018-03-17 ENCOUNTER — Emergency Department (HOSPITAL_COMMUNITY)
Admission: EM | Admit: 2018-03-17 | Discharge: 2018-03-17 | Disposition: A | Discharge status: Home / Self Care | Payer: BC Managed Care – PPO | Attending: Emergency Medicine | Admitting: Emergency Medicine

## 2018-03-17 ENCOUNTER — Encounter (HOSPITAL_COMMUNITY): Payer: Self-pay | Admitting: Emergency Medicine

## 2018-03-17 DIAGNOSIS — X500XXA Overexertion from strenuous movement or load, initial encounter: Secondary | ICD-10-CM | POA: Insufficient documentation

## 2018-03-17 DIAGNOSIS — Y998 Other external cause status: Secondary | ICD-10-CM | POA: Diagnosis not present

## 2018-03-17 DIAGNOSIS — Z79899 Other long term (current) drug therapy: Secondary | ICD-10-CM | POA: Diagnosis not present

## 2018-03-17 DIAGNOSIS — Y9389 Activity, other specified: Secondary | ICD-10-CM | POA: Diagnosis not present

## 2018-03-17 DIAGNOSIS — Y929 Unspecified place or not applicable: Secondary | ICD-10-CM | POA: Diagnosis not present

## 2018-03-17 DIAGNOSIS — S39012A Strain of muscle, fascia and tendon of lower back, initial encounter: Secondary | ICD-10-CM | POA: Diagnosis not present

## 2018-03-17 DIAGNOSIS — S39012S Strain of muscle, fascia and tendon of lower back, sequela: Secondary | ICD-10-CM

## 2018-03-17 DIAGNOSIS — F17210 Nicotine dependence, cigarettes, uncomplicated: Secondary | ICD-10-CM | POA: Diagnosis not present

## 2018-03-17 DIAGNOSIS — S3992XA Unspecified injury of lower back, initial encounter: Secondary | ICD-10-CM | POA: Diagnosis present

## 2018-03-17 MED ORDER — DEXAMETHASONE SODIUM PHOSPHATE 10 MG/ML IJ SOLN
10.0000 mg | Freq: Once | INTRAMUSCULAR | Status: AC
  Administered 2018-03-17: 10 mg via INTRAMUSCULAR
  Filled 2018-03-17: qty 1

## 2018-03-17 MED ORDER — CYCLOBENZAPRINE HCL 10 MG PO TABS: 5.0000 mg | ORAL_TABLET | Freq: Two times a day (BID) | ORAL | 0 refills | Status: DC | PRN

## 2018-03-17 MED ORDER — KETOROLAC TROMETHAMINE 60 MG/2ML IM SOLN
60.0000 mg | Freq: Once | INTRAMUSCULAR | Status: AC
  Administered 2018-03-17: 60 mg via INTRAMUSCULAR
  Filled 2018-03-17: qty 2

## 2018-03-17 MED ORDER — MELOXICAM 15 MG PO TABS: 15.0000 mg | ORAL_TABLET | Freq: Every day | ORAL | 0 refills | Status: DC

## 2018-03-17 MED ORDER — TRAMADOL HCL 50 MG PO TABS: 50.0000 mg | ORAL_TABLET | Freq: Four times a day (QID) | ORAL | 0 refills | Status: DC | PRN

## 2018-03-17 NOTE — ED Provider Notes (Signed)
[facility] DEPT Provider Note   CSN: [id number] Arrival date & time: 03/17/18  1450     History   Chief Complaint Chief Complaint  Patient presents with  . Back Pain    HPI Jean Ferguson is a 42 y.o. female who presents the emergency department chief complaint of lumbar pain.  Patient states that last Tuesday she helped her daughter move.  2 days later she began having significant pain in her lumbar and sacral region with stiffness.  She says that the pain is decreased over the past week however she still rates it at about a 7 out of 10.  She says that the pain radiates through both of her glutes and into the back of both thighs.  The pain is worse when she tries to bend at the waist forward.  She feels better if she keeps her back very straight and squats to get things off of the floor.  Pain is also worse with any twisting.  It is affecting her activities of daily living.  She has not tried anything for her pain.  She states that she has not had money to afford over-the-counter interventions because she has not gotten paid yet.  She denies saddle anesthesia, loss of bowel or bladder continence, weakness in her lower extremities, paresthesias in her legs.  He denies urinary symptoms.  HPI  Past Medical History:  Diagnosis Date  . Anemia due to blood loss, chronic   . Menorrhagia   . Uterine fibroid   . Wears glasses     Patient Active Problem List   Diagnosis Date Noted  . 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 Visual disturbance 02/25/2016  . Headache 02/25/2016  . 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 Menorrhagia 02/06/2016  . Fibroid uterus 01/27/2016    Past Surgical History:  Procedure Laterality Date  . CESAREAN SECTION  x3   last one 1997   Bilateral Tubal Ligation w/ last one  . DILITATION & CURRETTAGE/HYSTROSCOPY WITH HYDROTHERMAL ABLATION N/A 03/30/2017   Procedure: DILATATION & CURETTAGE/HYSTEROSCOPY WITH HYDROTHERMAL ABLATION;   Surgeon: Donnamae Jude, MD;  Location: [facility];  Service: Gynecology;  Laterality: N/A;     OB History    Gravida  3   Para  3   Term      Preterm  3   AB      Living  3     SAB      TAB      Ectopic      Multiple      Live Births  3            Home Medications    Prior to Admission medications   Medication Sig Start Date End Date Taking? Authorizing Provider  cholecalciferol (VITAMIN D) 1000 units tablet Take 1,000 Units by mouth daily.   Yes [provider]  IRON PO Take 1 tablet by mouth daily.   Yes [provider]  famotidine (PEPCID) 20 MG tablet Take 1 tablet (20 mg total) by mouth 2 (two) times daily. Patient not taking: Reported on 03/17/2018 01/26/18   Carlisle Cater, PA-C  omeprazole (PRILOSEC) 20 MG capsule Take 1 capsule (20 mg total) by mouth daily. Patient not taking: Reported on 03/17/2018 01/29/18   Nahser, Wonda Cheng, MD    Family History Family History  Problem Relation Age of Onset  . Diabetes Mother   . Lung cancer Father   . Diabetes Father   .  Heart attack Paternal Grandfather   . Stroke Paternal Uncle   . Diabetes Paternal Uncle   . Heart attack Paternal Uncle        x 3 brothers  . Stroke Paternal Aunt   . Diabetes Paternal Aunt   . Heart attack Paternal Aunt     Social History Social History   Tobacco Use  . Smoking status: Current Every Day Smoker    Packs/day: 0.25    Years: 15.00    Pack years: 3.75    Types: Cigarettes  . Smokeless tobacco: Never Used  . Tobacco comment: 6 cig per day  Substance Use Topics  . Alcohol use: Yes    Comment: occasional  . Drug use: No     Allergies   Morphine and related   Review of Systems Review of Systems  Ten systems reviewed and are negative for acute change, except as noted in the HPI.   Physical Exam Updated Vital Signs BP (!) 145/102   Pulse 67   Temp 98.4 F (36.9 C)   Resp 17   SpO2 96%   Physical Exam  Physical Exam   Nursing note and vitals reviewed. Constitutional: She is oriented to person, place, and time. She appears well-developed and well-nourished. No distress.  HENT:  Head: Normocephalic and atraumatic.  Eyes: Conjunctivae normal and EOM are normal. Pupils are equal, round, and reactive to light. No scleral icterus.  Neck: Normal range of motion.  Cardiovascular: Normal rate, regular rhythm and normal heart sounds.  Exam reveals no gallop and no friction rub.   No murmur heard. Pulmonary/Chest: Effort normal and breath sounds normal. No respiratory distress.  Abdominal: Soft. Bowel sounds are normal. She exhibits no distension and no mass. There is no tenderness. There is no guarding.  Musculoskeletal: Patient appears to be in mild to moderate pain, antalgic gait noted.  She has no midline lumbar tenderness.  She is significantly tender over the sacrum and gluteal region.  She painful and reduced LS ROM noted. Straight leg raise is negative on bilateral. DTR's, motor strength and sensation normal, including heel and toe gait.  Peripheral pulses are palpable. Neurological: She is alert and oriented to person, place, and time.  Skin: Skin is warm and dry. She is not diaphoretic.    ED Treatments / Results  Labs (all labs ordered are listed, but only abnormal results are displayed) Labs Reviewed - No data to display  EKG None  Radiology No results found.  Procedures Procedures (including critical care time)  Medications Ordered in ED Medications - No data to display   Initial Impression / Assessment and Plan / ED Course  I have reviewed the triage vital signs and the nursing notes.  Pertinent labs & imaging results that were available during my care of the patient were reviewed by me and considered in my medical decision making (see chart for details).  Clinical Course as of Mar 17 1544  Wed Mar 17, 2018  1531 [place] PMP aware reviewed.  No narcotic or other controlled substances in the  past year.   [AH]    Clinical Course User Index [AH] Margarita Mail, PA-C    Patient with back pain.  No neurological deficits and normal neuro exam. Imaging reviewed by me and shows no acute abnormalities. No joint space widening or loss of lordotic curve. Patient can walk but states is painful.  No loss of bowel or bladder control.  No concern for cauda equina.  No fever, night  sweats, weight loss, h/o cancer, IVDU.  RICE protocol and pain medicine indicated and discussed with patient.    Final Clinical Impressions(s) / ED Diagnoses   Final diagnoses:  None    ED Discharge Orders    None       Margarita Mail, PA-C 03/17/18 1705    Margette Fast, MD 03/18/18 [phone]

## 2018-03-17 NOTE — ED Triage Notes (Signed)
Patient c/o lower back pain x2 weeks. Denies urinary sx. States pain worsens with movement.

## 2018-03-17 NOTE — ED Notes (Signed)
Pt is alert and oriented x 4 and is verbal;ly responsive. Pt reports lower back pain x 1 week. Pt denies any pain or burning with urination. Pt denies taking any medication for discomfort.

## 2018-07-01 ENCOUNTER — Encounter (HOSPITAL_COMMUNITY): Payer: Self-pay

## 2018-07-01 ENCOUNTER — Other Ambulatory Visit: Payer: Self-pay

## 2018-07-01 ENCOUNTER — Emergency Department (HOSPITAL_COMMUNITY)
Admission: EM | Admit: 2018-07-01 | Discharge: 2018-07-01 | Disposition: A | Discharge status: Home / Self Care | Payer: BC Managed Care – PPO | Attending: Emergency Medicine | Admitting: Emergency Medicine

## 2018-07-01 DIAGNOSIS — F17210 Nicotine dependence, cigarettes, uncomplicated: Secondary | ICD-10-CM | POA: Insufficient documentation

## 2018-07-01 DIAGNOSIS — Z79899 Other long term (current) drug therapy: Secondary | ICD-10-CM | POA: Insufficient documentation

## 2018-07-01 DIAGNOSIS — J069 Acute upper respiratory infection, unspecified: Secondary | ICD-10-CM | POA: Insufficient documentation

## 2018-07-01 DIAGNOSIS — R197 Diarrhea, unspecified: Secondary | ICD-10-CM | POA: Diagnosis not present

## 2018-07-01 DIAGNOSIS — R0981 Nasal congestion: Secondary | ICD-10-CM | POA: Diagnosis present

## 2018-07-01 MED ORDER — BENZONATATE 100 MG PO CAPS: 100.0000 mg | ORAL_CAPSULE | Freq: Three times a day (TID) | ORAL | 0 refills | Status: DC

## 2018-07-01 NOTE — ED Provider Notes (Signed)
[facility] EMERGENCY DEPARTMENT Provider Note   CSN: [id number] Arrival date & time: 07/01/18  7014     History   Chief Complaint Chief Complaint  Patient presents with  . Flu Like Symptoms    HPI Jean Ferguson is a 43 y.o. female.  Patient with significant medical history presents with nasal congestion, cough, rhinorrhea, diarrhea for the past 4 days. No fever at any time. She has been exposed to multiple family members with similar symptoms. No nausea or vomiting. No rash. She feels generally fatigued. Decreased appetite but is drinking plenty of fluids.   The history is provided by the patient. No language interpreter was used.    Past Medical History:  Diagnosis Date  . Anemia due to blood loss, chronic   . Menorrhagia   . Uterine fibroid   . Wears glasses     Patient Active Problem List   Diagnosis Date Noted  . 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 Visual disturbance 02/25/2016  . Headache 02/25/2016  . 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 Menorrhagia 02/06/2016  . Fibroid uterus 01/27/2016    Past Surgical History:  Procedure Laterality Date  . CESAREAN SECTION  x3   last one 1997   Bilateral Tubal Ligation w/ last one  . DILITATION & CURRETTAGE/HYSTROSCOPY WITH HYDROTHERMAL ABLATION N/A 03/30/2017   Procedure: DILATATION & CURETTAGE/HYSTEROSCOPY WITH HYDROTHERMAL ABLATION;  Surgeon: Donnamae Jude, MD;  Location: [facility];  Service: Gynecology;  Laterality: N/A;     OB History    Gravida  3   Para  3   Term      Preterm  3   AB      Living  3     SAB      TAB      Ectopic      Multiple      Live Births  3            Home Medications    Prior to Admission medications   Medication Sig Start Date End Date Taking? Authorizing Provider  cholecalciferol (VITAMIN D) 1000 units tablet Take 1,000 Units by mouth daily.    [provider]  cyclobenzaprine (FLEXERIL) 10 MG tablet Take  0.5-1 tablets (5-10 mg total) by mouth 2 (two) times daily as needed for muscle spasms. 03/17/18   Harris, Vernie Shanks, PA-C  famotidine (PEPCID) 20 MG tablet Take 1 tablet (20 mg total) by mouth 2 (two) times daily. Patient not taking: Reported on 03/17/2018 01/26/18   Carlisle Cater, PA-C  IRON PO Take 1 tablet by mouth daily.    [provider]  meloxicam (MOBIC) 15 MG tablet Take 1 tablet (15 mg total) by mouth daily. Take 1 daily with food. 03/17/18   Margarita Mail, PA-C  omeprazole (PRILOSEC) 20 MG capsule Take 1 capsule (20 mg total) by mouth daily. Patient not taking: Reported on 03/17/2018 01/29/18   Nahser, Wonda Cheng, MD  traMADol (ULTRAM) 50 MG tablet Take 1 tablet (50 mg total) by mouth every 6 (six) hours as needed for severe pain. 03/17/18   Margarita Mail, PA-C    Family History Family History  Problem Relation Age of Onset  . Diabetes Mother   . Lung cancer Father   . Diabetes Father   . Heart attack Paternal Grandfather   . Stroke Paternal Uncle   . Diabetes Paternal Uncle   . Heart attack Paternal Uncle        x 3  brothers  . Stroke Paternal Aunt   . Diabetes Paternal Aunt   . Heart attack Paternal Aunt     Social History Social History   Tobacco Use  . Smoking status: Current Every Day Smoker    Packs/day: 0.25    Years: 15.00    Pack years: 3.75    Types: Cigarettes  . Smokeless tobacco: Never Used  . Tobacco comment: 6 cig per day  Substance Use Topics  . Alcohol use: Yes    Comment: occasional  . Drug use: No     Allergies   Morphine and related   Review of Systems Review of Systems  Constitutional: Positive for appetite change and fatigue. Negative for chills and fever.  HENT: Positive for congestion, rhinorrhea and sinus pressure.   Respiratory: Positive for cough. Negative for shortness of breath and wheezing.   Cardiovascular: Negative.   Gastrointestinal: Positive for diarrhea. Negative for nausea and vomiting.  Genitourinary:  Negative for decreased urine volume.  Musculoskeletal: Negative.   Skin: Negative.   Neurological: Negative.      Physical Exam Updated Vital Signs BP (!) 146/87 (BP Location: Left Arm)   Pulse 77   Temp 98.6 F (37 C) (Oral)   Resp 20   SpO2 100%   Physical Exam Constitutional:      General: She is not in acute distress.    Appearance: She is well-developed.  HENT:     Head: Normocephalic.     Nose: Rhinorrhea present.     Mouth/Throat:     Mouth: Mucous membranes are moist.     Pharynx: Oropharynx is clear.  Eyes:     Conjunctiva/sclera: Conjunctivae normal.  Neck:     Musculoskeletal: Normal range of motion and neck supple.  Cardiovascular:     Rate and Rhythm: Normal rate and regular rhythm.  Pulmonary:     Effort: Pulmonary effort is normal.     Breath sounds: Normal breath sounds. No wheezing, rhonchi or rales.  Abdominal:     General: Bowel sounds are normal.     Palpations: Abdomen is soft.     Tenderness: There is no abdominal tenderness. There is no guarding or rebound.  Musculoskeletal: Normal range of motion.  Skin:    General: Skin is warm and dry.     Findings: No rash.  Neurological:     Mental Status: She is alert and oriented to person, place, and time.      ED Treatments / Results  Labs (all labs ordered are listed, but only abnormal results are displayed) Labs Reviewed - No data to display  EKG None  Radiology No results found.  Procedures Procedures (including critical care time)  Medications Ordered in ED Medications - No data to display   Initial Impression / Assessment and Plan / ED Course  I have reviewed the triage vital signs and the nursing notes.  Pertinent labs & imaging results that were available during my care of the patient were reviewed by me and considered in my medical decision making (see chart for details).     Patient to ED with afebrile URI illness x 4 days.   She is well hydrated, nontoxic. VSS.  Symptoms likely represent viral illness given multiple exposures. Recommend supportive care.   Final Clinical Impressions(s) / ED Diagnoses   Final diagnoses:  None   1. URI  ED Discharge Orders    None       Charlann Lange, PA-C 07/01/18 [place],  Wille Glaser, MD 07/02/18 [id number]

## 2018-07-01 NOTE — Discharge Instructions (Signed)
Recommend Advil cold and sinus for additional symptom relief. Take Tessalon for cough as directed. Push fluids.

## 2018-08-30 ENCOUNTER — Encounter (HOSPITAL_COMMUNITY): Payer: Self-pay | Admitting: Emergency Medicine

## 2018-08-30 ENCOUNTER — Ambulatory Visit (HOSPITAL_COMMUNITY)
Admission: EM | Admit: 2018-08-30 | Discharge: 2018-08-30 | Disposition: A | Discharge status: Home / Self Care | Payer: BC Managed Care – PPO | Attending: Family Medicine | Admitting: Family Medicine

## 2018-08-30 DIAGNOSIS — S46812A Strain of other muscles, fascia and tendons at shoulder and upper arm level, left arm, initial encounter: Secondary | ICD-10-CM | POA: Diagnosis not present

## 2018-08-30 MED ORDER — CYCLOBENZAPRINE HCL 5 MG PO TABS: 5.0000 mg | ORAL_TABLET | Freq: Every day | ORAL | 0 refills | Status: DC

## 2018-08-30 MED ORDER — MELOXICAM 7.5 MG PO TABS: 7.5000 mg | ORAL_TABLET | Freq: Every day | ORAL | 0 refills | Status: DC

## 2018-08-30 NOTE — ED Triage Notes (Signed)
Pt cc pt has lower back pain. Pt was cleaning up at school and she injured her back at that time. It has been a week now .

## 2018-08-30 NOTE — Discharge Instructions (Signed)
Light and regular activity as tolerated.  Flexeril at night. May cause drowsiness. Please do not take if driving or drinking alcohol.   Meloxicam daily, take with food. Don't take additional ibuprofen or aleve.  Heat, massage to the area.  If persistent symptoms please follow up with your primary care provider. Return or go to ER for any worsening of symptoms.

## 2018-08-30 NOTE — ED Provider Notes (Signed)
[facility]    CSN: [id number] Arrival date & time: 08/30/18  1538     History   Chief Complaint Chief Complaint  Patient presents with  . Back Pain    HPI Jean Ferguson is a 43 y.o. female.   Jean Ferguson presents with complaints of left posterior shoulder/ neck soreness which has been worsening over the past week. 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 Pain 8/10. Worse with movement, primarily with turning left. No specific injury. No numbness tingling or weakness to upper arms or hands. No previous similar. States has had a low back strain in the past. She is right handed. Has been taking tylenol which hasn't helped. Without contributing medical history.     ROS per HPI, negative if not otherwise mentioned.      Past Medical History:  Diagnosis Date  . Anemia due to blood loss, chronic   . Menorrhagia   . Uterine fibroid   . Wears glasses     Patient Active Problem List   Diagnosis Date Noted  . 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 Visual disturbance 02/25/2016  . Headache 02/25/2016  . 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 Menorrhagia 02/06/2016  . Fibroid uterus 01/27/2016    Past Surgical History:  Procedure Laterality Date  . CESAREAN SECTION  x3   last one 1997   Bilateral Tubal Ligation w/ last one  . DILITATION & CURRETTAGE/HYSTROSCOPY WITH HYDROTHERMAL ABLATION N/A 03/30/2017   Procedure: DILATATION & CURETTAGE/HYSTEROSCOPY WITH HYDROTHERMAL ABLATION;  Surgeon: Donnamae Jude, MD;  Location: [facility];  Service: Gynecology;  Laterality: N/A;    OB History    Gravida  3   Para  3   Term      Preterm  3   AB      Living  3     SAB      TAB      Ectopic      Multiple      Live Births  3            Home Medications    Prior to Admission medications   Medication Sig Start Date End Date Taking? Authorizing Provider  cholecalciferol  (VITAMIN D) 1000 units tablet Take 1,000 Units by mouth daily.    [provider]  cyclobenzaprine (FLEXERIL) 5 MG tablet Take 1 tablet (5 mg total) by mouth at bedtime. 08/30/18   Zigmund Gottron, NP  famotidine (PEPCID) 20 MG tablet Take 1 tablet (20 mg total) by mouth 2 (two) times daily. Patient not taking: Reported on 03/17/2018 01/26/18   Carlisle Cater, PA-C  IRON PO Take 1 tablet by mouth daily.    [provider]  meloxicam (MOBIC) 7.5 MG tablet Take 1 tablet (7.5 mg total) by mouth daily. 08/30/18   Zigmund Gottron, NP    Family History Family History  Problem Relation Age of Onset  . Diabetes Mother   . Lung cancer Father   . Diabetes Father   . Heart attack Paternal Grandfather   . Stroke Paternal Uncle   . Diabetes Paternal Uncle   . Heart attack Paternal Uncle        x 3 brothers  . Stroke Paternal Aunt   . Diabetes Paternal Aunt   . Heart attack Paternal Aunt     Social History Social History   Tobacco Use  . Smoking status:  Current Every Day Smoker    Packs/day: 0.25    Years: 15.00    Pack years: 3.75    Types: Cigarettes  . Smokeless tobacco: Never Used  . Tobacco comment: 6 cig per day  Substance Use Topics  . Alcohol use: Yes    Comment: occasional  . Drug use: No     Allergies   Morphine and related   Review of Systems Review of Systems   Physical Exam Triage Vital Signs ED Triage Vitals  Enc Vitals Group     BP 08/30/18 1555 135/72     Pulse Rate 08/30/18 1555 87     Resp 08/30/18 1555 18     Temp 08/30/18 1555 98.5 F (36.9 C)     Temp Source 08/30/18 1555 Oral     SpO2 08/30/18 1555 100 %     Weight --      Height --      Head Circumference --      Peak Flow --      Pain Score 08/30/18 1646 5     Pain Loc --      Pain Edu? --      Excl. in [place]? --    No data found.  Updated Vital Signs BP 135/72 (BP Location: Right Arm)   Pulse 87   Temp 98.5 F (36.9 C) (Oral)   Resp 18   SpO2 100%    Physical  Exam Constitutional:      General: She is not in acute distress.    Appearance: She is well-developed.  Neck:     Musculoskeletal: Decreased range of motion. Muscular tenderness present. No erythema, neck rigidity or spinous process tenderness.     Comments: Left posterior neck musculature tenderness on palpation and with ROM Cardiovascular:     Rate and Rhythm: Normal rate and regular rhythm.     Heart sounds: Normal heart sounds.  Pulmonary:     Effort: Pulmonary effort is normal.     Breath sounds: Normal breath sounds.  Musculoskeletal:     Left shoulder: She exhibits decreased range of motion, tenderness and pain. She exhibits no bony tenderness, no swelling, no effusion, no crepitus, no deformity, no laceration, no spasm, normal pulse and normal strength.     Comments: Left posterior shoulder musculature with tenderness; no bony pain; pain with ROM which engages left trapezius; strong radial pulse, gross sensation intact  Skin:    General: Skin is warm and dry.  Neurological:     Mental Status: She is alert and oriented to person, place, and time.      UC Treatments / Results  Labs (all labs ordered are listed, but only abnormal results are displayed) Labs Reviewed - No data to display  EKG None  Radiology No results found.  Procedures Procedures (including critical care time)  Medications Ordered in UC Medications - No data to display  Initial Impression / Assessment and Plan / UC Course  I have reviewed the triage vital signs and the nursing notes.  Pertinent labs & imaging results that were available during my care of the patient were reviewed by me and considered in my medical decision making (see chart for details).     Left upper back/neck musculature, consistent with trapezius soreness after increased physical labor. nsaids discussed, muscle relaxer, heat, massage, stretching. Follow up with PCP as needed. Patient verbalized understanding and agreeable to  plan.   Final Clinical Impressions(s) / UC Diagnoses   Final diagnoses:  Trapezius muscle strain, left, initial encounter     Discharge Instructions     Light and regular activity as tolerated.  Flexeril at night. May cause drowsiness. Please do not take if driving or drinking alcohol.   Meloxicam daily, take with food. Don't take additional ibuprofen or aleve.  Heat, massage to the area.  If persistent symptoms please follow up with your primary care provider. Return or go to ER for any worsening of symptoms.    ED Prescriptions    Medication Sig Dispense Auth. Provider   cyclobenzaprine (FLEXERIL) 5 MG tablet Take 1 tablet (5 mg total) by mouth at bedtime. 15 tablet Augusto Gamble B, NP   meloxicam (MOBIC) 7.5 MG tablet Take 1 tablet (7.5 mg total) by mouth daily. 20 tablet Zigmund Gottron, NP     Controlled Substance Prescriptions [place] Controlled Substance Registry consulted? Not Applicable   Zigmund Gottron, NP 08/30/18 [phone]

## 2018-12-22 ENCOUNTER — Other Ambulatory Visit: Payer: Self-pay

## 2018-12-22 DIAGNOSIS — Z20822 Contact with and (suspected) exposure to covid-19: Secondary | ICD-10-CM

## 2018-12-26 LAB — NOVEL CORONAVIRUS, NAA: SARS-CoV-2, NAA: NOT DETECTED

## 2018-12-29 ENCOUNTER — Telehealth: Payer: Self-pay | Admitting: Family Medicine

## 2018-12-29 NOTE — Telephone Encounter (Signed)
Advised patient her COVID 19 test result came back negative.

## 2018-12-30 ENCOUNTER — Other Ambulatory Visit: Payer: Self-pay

## 2018-12-30 ENCOUNTER — Ambulatory Visit (HOSPITAL_COMMUNITY)
Admission: EM | Admit: 2018-12-30 | Discharge: 2018-12-30 | Disposition: A | Discharge status: Home / Self Care | Payer: BC Managed Care – PPO | Attending: Internal Medicine | Admitting: Internal Medicine

## 2018-12-30 ENCOUNTER — Encounter (HOSPITAL_COMMUNITY): Payer: Self-pay

## 2018-12-30 DIAGNOSIS — K047 Periapical abscess without sinus: Secondary | ICD-10-CM

## 2018-12-30 MED ORDER — KETOROLAC TROMETHAMINE 30 MG/ML IJ SOLN
30.0000 mg | Freq: Once | INTRAMUSCULAR | Status: AC
  Administered 2018-12-30: 11:00:00 30 mg via INTRAMUSCULAR

## 2018-12-30 MED ORDER — AMOXICILLIN-POT CLAVULANATE 875-125 MG PO TABS: 1.0000 | ORAL_TABLET | Freq: Two times a day (BID) | ORAL | 0 refills | Status: DC

## 2018-12-30 MED ORDER — KETOROLAC TROMETHAMINE 30 MG/ML IJ SOLN
INTRAMUSCULAR | Status: AC
  Filled 2018-12-30: qty 1

## 2018-12-30 NOTE — ED Provider Notes (Signed)
[facility]    CSN: [id number] Arrival date & time: 12/30/18  1004      History   Chief Complaint Chief Complaint  Patient presents with  . Dental Pain    HPI Jean Ferguson is a 43 y.o. female with no past medical history comes to urgent care with complaints of tooth pain 2 weeks duration.  Patient symptoms started 2 weeks ago and is gotten progressively worse.  Pain is constant, severe, throbbing and she has had some relief with ibuprofen 800 mg use.  Patient has poor dental hygiene and has multiple dental cavities and caries.  No fever or chills.  No nausea vomiting.  She has had some swelling over the right side of the face which is currently resolved.  No shortness of breath, cough or sputum production.  HPI  Past Medical History:  Diagnosis Date  . Anemia due to blood loss, chronic   . Menorrhagia   . Uterine fibroid   . Wears glasses     Patient Active Problem List   Diagnosis Date Noted  . 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 Visual disturbance 02/25/2016  . Headache 02/25/2016  . 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 Menorrhagia 02/06/2016  . Fibroid uterus 01/27/2016    Past Surgical History:  Procedure Laterality Date  . CESAREAN SECTION  x3   last one 1997   Bilateral Tubal Ligation w/ last one  . DILITATION & CURRETTAGE/HYSTROSCOPY WITH HYDROTHERMAL ABLATION N/A 03/30/2017   Procedure: DILATATION & CURETTAGE/HYSTEROSCOPY WITH HYDROTHERMAL ABLATION;  Surgeon: Donnamae Jude, MD;  Location: [facility];  Service: Gynecology;  Laterality: N/A;    OB History    Gravida  3   Para  3   Term      Preterm  3   AB      Living  3     SAB      TAB      Ectopic      Multiple      Live Births  3            Home Medications    Prior to Admission medications   Medication Sig Start Date End Date Taking? Authorizing Provider  cholecalciferol (VITAMIN D) 1000 units tablet Take 1,000 Units by mouth daily.     [provider]  cyclobenzaprine (FLEXERIL) 5 MG tablet Take 1 tablet (5 mg total) by mouth at bedtime. 08/30/18   Zigmund Gottron, NP  famotidine (PEPCID) 20 MG tablet Take 1 tablet (20 mg total) by mouth 2 (two) times daily. Patient not taking: Reported on 03/17/2018 01/26/18   Carlisle Cater, PA-C  IRON PO Take 1 tablet by mouth daily.    [provider]  meloxicam (MOBIC) 7.5 MG tablet Take 1 tablet (7.5 mg total) by mouth daily. 08/30/18   Zigmund Gottron, NP    Family History Family History  Problem Relation Age of Onset  . Diabetes Mother   . Lung cancer Father   . Diabetes Father   . Heart attack Paternal Grandfather   . Stroke Paternal Uncle   . Diabetes Paternal Uncle   . Heart attack Paternal Uncle        x 3 brothers  . Stroke Paternal Aunt   . Diabetes Paternal Aunt   . Heart attack Paternal Aunt     Social History Social History   Tobacco Use  . Smoking status: Current Every Day Smoker    Packs/day: 0.25  Years: 15.00    Pack years: 3.75    Types: Cigarettes  . Smokeless tobacco: Never Used  . Tobacco comment: 6 cig per day  Substance Use Topics  . Alcohol use: Yes    Comment: occasional  . Drug use: No     Allergies   Morphine and related   Review of Systems Review of Systems  Constitutional: Negative for activity change, chills, fatigue and fever.  HENT: Positive for dental problem and facial swelling. Negative for ear pain, nosebleeds, postnasal drip, rhinorrhea, sinus pressure, sinus pain and sore throat.   Respiratory: Negative for cough, shortness of breath and wheezing.   Gastrointestinal: Negative.   Genitourinary: Negative for dysuria, frequency and urgency.  Musculoskeletal: Negative.   Neurological: Negative for dizziness, facial asymmetry, weakness, light-headedness, numbness and headaches.     Physical Exam Triage Vital Signs ED Triage Vitals  Enc Vitals Group     BP 12/30/18 1026 128/80     Pulse Rate  12/30/18 1026 69     Resp 12/30/18 1026 18     Temp 12/30/18 1026 97.9 F (36.6 C)     Temp Source 12/30/18 1026 Oral     SpO2 12/30/18 1026 99 %     Weight --      Height --      Head Circumference --      Peak Flow --      Pain Score 12/30/18 1032 8     Pain Loc --      Pain Edu? --      Excl. in [place]? --    No data found.  Updated Vital Signs BP 128/80 (BP Location: Left Arm)   Pulse 69   Temp 97.9 F (36.6 C) (Oral)   Resp 18   SpO2 99%   Visual Acuity Right Eye Distance:   Left Eye Distance:   Bilateral Distance:    Right Eye Near:   Left Eye Near:    Bilateral Near:     Physical Exam Vitals signs and nursing note reviewed.  Constitutional:      General: She is in acute distress.     Appearance: Normal appearance. She is not ill-appearing.  HENT:     Right Ear: Tympanic membrane normal.     Left Ear: Tympanic membrane normal.     Nose: No rhinorrhea.     Mouth/Throat:     Mouth: Mucous membranes are moist.     Pharynx: No posterior oropharyngeal erythema.     Comments: Poor dental hygiene.  Multiple dental cavities.  Several teeth are missing.  No drainage from the gums. Neck:     Musculoskeletal: Normal range of motion. No neck rigidity.  Cardiovascular:     Rate and Rhythm: Normal rate and regular rhythm.     Pulses: Normal pulses.     Heart sounds: Normal heart sounds.  Pulmonary:     Effort: Pulmonary effort is normal.     Breath sounds: Normal breath sounds.  Abdominal:     General: Bowel sounds are normal. There is no distension.     Palpations: Abdomen is soft.     Tenderness: There is no abdominal tenderness. There is no guarding.  Lymphadenopathy:     Cervical: No cervical adenopathy.  Skin:    General: Skin is warm.  Neurological:     Mental Status: She is alert.      UC Treatments / Results  Labs (all labs ordered are listed, but only abnormal results are  displayed) Labs Reviewed - No data to display  EKG   Radiology No  results found.  Procedures Procedures (including critical care time)  Medications Ordered in UC Medications - No data to display  Initial Impression / Assessment and Plan / UC Course  I have reviewed the triage vital signs and the nursing notes.  Pertinent labs & imaging results that were available during my care of the patient were reviewed by me and considered in my medical decision making (see chart for details).     1.  Dental infection, multiple areas: Augmentin 875-125 mg twice daily for 7 days Patient was given information on dental resources in the community to have dental work done. Continue ibuprofen 800 mg as needed for pain If patient's symptoms worsen she is advised to return to urgent care to be reevaluated.  Final Clinical Impressions(s) / UC Diagnoses   Final diagnoses:  None   Discharge Instructions   None    ED Prescriptions    None     Controlled Substance Prescriptions [place] Controlled Substance Registry consulted? No   Chase Picket, MD 12/30/18 1101

## 2018-12-30 NOTE — ED Triage Notes (Signed)
Pt presents with dental pain on both sides X 2 weeks.

## 2019-01-13 ENCOUNTER — Encounter (HOSPITAL_COMMUNITY): Payer: Self-pay

## 2019-01-13 ENCOUNTER — Ambulatory Visit (HOSPITAL_COMMUNITY)
Admission: EM | Admit: 2019-01-13 | Discharge: 2019-01-13 | Disposition: A | Discharge status: Home / Self Care | Payer: BC Managed Care – PPO | Attending: Internal Medicine | Admitting: Internal Medicine

## 2019-01-13 ENCOUNTER — Other Ambulatory Visit: Payer: Self-pay

## 2019-01-13 DIAGNOSIS — B3731 Acute candidiasis of vulva and vagina: Secondary | ICD-10-CM

## 2019-01-13 DIAGNOSIS — B373 Candidiasis of vulva and vagina: Secondary | ICD-10-CM | POA: Diagnosis not present

## 2019-01-13 MED ORDER — FLUCONAZOLE 200 MG PO TABS: ORAL_TABLET | ORAL | 0 refills | Status: DC

## 2019-01-13 NOTE — ED Triage Notes (Signed)
Pt states she has allergy reaction. Pt states she rash from the amox/ aug .( vaginal itching , rash, vaginal discharge ) pt continue to take the med. Pt states she has 2 pills left.

## 2019-01-13 NOTE — ED Provider Notes (Signed)
[place]    CSN: [id number] Arrival date & time: 01/13/19  1626      History   Chief Complaint Chief Complaint  Patient presents with  . Allergic Reaction    HPI Jean Ferguson is a 43 y.o. female recently treated in the urgent care for dental abscess with Augmentin comes to urgent care with complaints of pruritic whitish vaginal discharge of a few days duration.  Patient symptoms started a couple of days after she started taking Augmentin.  Patient denies any dysuria urgency or frequency.  She also had diarrhea which is currently subsided.  No abdominal pain or distention.  No nausea or vomiting.  No fever or chills.  Dental pain is resolved.  Dental infection seems to have subsided. HPI  Past Medical History:  Diagnosis Date  . Anemia due to blood loss, chronic   . Menorrhagia   . Uterine fibroid   . Wears glasses     Patient Active Problem List   Diagnosis Date Noted  . 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 Visual disturbance 02/25/2016  . Headache 02/25/2016  . 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 Menorrhagia 02/06/2016  . Fibroid uterus 01/27/2016    Past Surgical History:  Procedure Laterality Date  . CESAREAN SECTION  x3   last one 1997   Bilateral Tubal Ligation w/ last one  . DILITATION & CURRETTAGE/HYSTROSCOPY WITH HYDROTHERMAL ABLATION N/A 03/30/2017   Procedure: DILATATION & CURETTAGE/HYSTEROSCOPY WITH HYDROTHERMAL ABLATION;  Surgeon: Donnamae Jude, MD;  Location: [facility];  Service: Gynecology;  Laterality: N/A;    OB History    Gravida  3   Para  3   Term      Preterm  3   AB      Living  3     SAB      TAB      Ectopic      Multiple      Live Births  3            Home Medications    Prior to Admission medications   Medication Sig Start Date End Date Taking? Authorizing Provider  amoxicillin-clavulanate (AUGMENTIN) 875-125 MG tablet Take 1 tablet by mouth every 12 (twelve) hours. 12/30/18    Chase Picket, MD  cholecalciferol (VITAMIN D) 1000 units tablet Take 1,000 Units by mouth daily.    [provider]  cyclobenzaprine (FLEXERIL) 5 MG tablet Take 1 tablet (5 mg total) by mouth at bedtime. 08/30/18   Zigmund Gottron, NP  fluconazole (DIFLUCAN) 200 MG tablet Take 1 tablet now and repeat in 72 hours 01/13/19   Lamptey, Myrene Galas, MD  IRON PO Take 1 tablet by mouth daily.    [provider]  meloxicam (MOBIC) 7.5 MG tablet Take 1 tablet (7.5 mg total) by mouth daily. 08/30/18   Zigmund Gottron, NP  famotidine (PEPCID) 20 MG tablet Take 1 tablet (20 mg total) by mouth 2 (two) times daily. Patient not taking: Reported on 03/17/2018 01/26/18 12/30/18  Carlisle Cater, PA-C    Family History Family History  Problem Relation Age of Onset  . Diabetes Mother   . Lung cancer Father   . Diabetes Father   . Heart attack Paternal Grandfather   . Stroke Paternal Uncle   . Diabetes Paternal Uncle   . Heart attack Paternal Uncle        x 3 brothers  . Stroke Paternal Aunt   . Diabetes  Paternal Aunt   . Heart attack Paternal Aunt     Social History Social History   Tobacco Use  . Smoking status: Current Every Day Smoker    Packs/day: 0.25    Years: 15.00    Pack years: 3.75    Types: Cigarettes  . Smokeless tobacco: Never Used  . Tobacco comment: 6 cig per day  Substance Use Topics  . Alcohol use: Yes    Comment: occasional  . Drug use: No     Allergies   Augmentin [amoxicillin-pot clavulanate] and Morphine and related   Review of Systems Review of Systems  Constitutional: Negative.   HENT: Negative.   Respiratory: Negative.   Cardiovascular: Negative.   Gastrointestinal: Negative for abdominal pain, nausea and vomiting.  Genitourinary: Positive for genital sores and vaginal discharge. Negative for decreased urine volume, dyspareunia, dysuria, pelvic pain and vaginal pain.  Musculoskeletal: Negative.   Skin: Negative.   Neurological: Negative  for dizziness, tremors, weakness and headaches.     Physical Exam Triage Vital Signs ED Triage Vitals  Enc Vitals Group     BP 01/13/19 1706 (!) 183/100     Pulse --      Resp 01/13/19 1706 18     Temp 01/13/19 1706 98.9 F (37.2 C)     Temp Source 01/13/19 1706 Oral     SpO2 01/13/19 1706 100 %     Weight 01/13/19 1704 183 lb (83 kg)     Height --      Head Circumference --      Peak Flow --      Pain Score 01/13/19 1704 10     Pain Loc --      Pain Edu? --      Excl. in [place]? --    No data found.  Updated Vital Signs BP (!) 183/100 (BP Location: Right Arm)   Temp 98.9 F (37.2 C) (Oral)   Resp 18   Wt 83 kg   SpO2 100%   BMI 31.41 kg/m   Visual Acuity Right Eye Distance:   Left Eye Distance:   Bilateral Distance:    Right Eye Near:   Left Eye Near:    Bilateral Near:     Physical Exam Vitals signs and nursing note reviewed.  Constitutional:      General: She is not in acute distress.    Appearance: She is not ill-appearing or toxic-appearing.  Cardiovascular:     Rate and Rhythm: Normal rate and regular rhythm.     Pulses: Normal pulses.     Heart sounds: Normal heart sounds.  Pulmonary:     Effort: Pulmonary effort is normal.     Breath sounds: Normal breath sounds.  Abdominal:     General: Bowel sounds are normal.     Palpations: Abdomen is soft.  Musculoskeletal: Normal range of motion.  Skin:    General: Skin is warm.     Capillary Refill: Capillary refill takes less than 2 seconds.  Neurological:     General: No focal deficit present.     Mental Status: She is alert and oriented to person, place, and time.      UC Treatments / Results  Labs (all labs ordered are listed, but only abnormal results are displayed) Labs Reviewed - No data to display  EKG   Radiology No results found.  Procedures Procedures (including critical care time)  Medications Ordered in UC Medications - No data to display  Initial Impression / Assessment  and Plan / UC Course  I have reviewed the triage vital signs and the nursing notes.  Pertinent labs & imaging results that were available during my care of the patient were reviewed by me and considered in my medical decision making (see chart for details).     1.  Vaginal yeast infections following antibiotic use: Fluconazole 200 mg x 1 dose to be repeated in 72 hours if no improvement Patient is advised to give [place] a call if her symptoms does not subside. Final Clinical Impressions(s) / UC Diagnoses   Final diagnoses:  Vaginal yeast infection   Discharge Instructions   None    ED Prescriptions    Medication Sig Dispense Auth. Provider   fluconazole (DIFLUCAN) 200 MG tablet Take 1 tablet now and repeat in 72 hours 2 tablet Lamptey, Myrene Galas, MD     Controlled Substance Prescriptions [place] Controlled Substance Registry consulted? No   Chase Picket, MD 01/13/19 [phone]

## 2019-02-12 ENCOUNTER — Emergency Department (HOSPITAL_COMMUNITY)
Admission: EM | Admit: 2019-02-12 | Discharge: 2019-02-12 | Disposition: A | Discharge status: Home / Self Care | Payer: BC Managed Care – PPO | Attending: Emergency Medicine | Admitting: Emergency Medicine

## 2019-02-12 ENCOUNTER — Encounter (HOSPITAL_COMMUNITY): Payer: Self-pay | Admitting: Emergency Medicine

## 2019-02-12 ENCOUNTER — Other Ambulatory Visit: Payer: Self-pay

## 2019-02-12 DIAGNOSIS — R42 Dizziness and giddiness: Secondary | ICD-10-CM | POA: Diagnosis present

## 2019-02-12 DIAGNOSIS — Z5321 Procedure and treatment not carried out due to patient leaving prior to being seen by health care provider: Secondary | ICD-10-CM | POA: Diagnosis not present

## 2019-02-12 HISTORY — DX: Essential (primary) hypertension: I10

## 2019-02-12 NOTE — ED Notes (Signed)
Pt called for room. Per registration and security, pt just left.

## 2019-02-12 NOTE — ED Triage Notes (Signed)
Pt c/o dizziness that began around noon. Pt states dizziness worsens with movement. Pt denies hx of vertigo. Denies unilateral weakness/numbness, HA, vision changes, or slurred speech. AOx4.

## 2019-03-29 ENCOUNTER — Other Ambulatory Visit: Payer: Self-pay

## 2019-03-29 DIAGNOSIS — Z20822 Contact with and (suspected) exposure to covid-19: Secondary | ICD-10-CM

## 2019-03-31 LAB — NOVEL CORONAVIRUS, NAA: SARS-CoV-2, NAA: NOT DETECTED

## 2019-04-06 ENCOUNTER — Telehealth: Payer: Self-pay | Admitting: General Practice

## 2019-04-06 NOTE — Telephone Encounter (Signed)
Negative COVID results given. Patient results "NOT Detected." Caller expressed understanding. ° °

## 2019-04-26 ENCOUNTER — Other Ambulatory Visit: Payer: Self-pay

## 2019-04-26 DIAGNOSIS — Z20822 Contact with and (suspected) exposure to covid-19: Secondary | ICD-10-CM

## 2019-04-28 LAB — NOVEL CORONAVIRUS, NAA: SARS-CoV-2, NAA: NOT DETECTED

## 2019-05-28 ENCOUNTER — Ambulatory Visit (HOSPITAL_COMMUNITY)
Admission: EM | Admit: 2019-05-28 | Discharge: 2019-05-28 | Disposition: A | Discharge status: Home / Self Care | Payer: BC Managed Care – PPO | Attending: Urgent Care | Admitting: Urgent Care

## 2019-05-28 ENCOUNTER — Other Ambulatory Visit: Payer: Self-pay

## 2019-05-28 ENCOUNTER — Encounter (HOSPITAL_COMMUNITY): Payer: Self-pay

## 2019-05-28 DIAGNOSIS — R220 Localized swelling, mass and lump, head: Secondary | ICD-10-CM | POA: Diagnosis not present

## 2019-05-28 DIAGNOSIS — K047 Periapical abscess without sinus: Secondary | ICD-10-CM | POA: Diagnosis not present

## 2019-05-28 DIAGNOSIS — R519 Headache, unspecified: Secondary | ICD-10-CM | POA: Diagnosis not present

## 2019-05-28 MED ORDER — FLUCONAZOLE 200 MG PO TABS: 200.0000 mg | ORAL_TABLET | ORAL | 0 refills | Status: DC

## 2019-05-28 MED ORDER — CEFTRIAXONE SODIUM 1 G IJ SOLR
INTRAMUSCULAR | Status: AC
  Filled 2019-05-28: qty 10

## 2019-05-28 MED ORDER — IBUPROFEN 800 MG PO TABS
ORAL_TABLET | ORAL | Status: AC
  Filled 2019-05-28: qty 1

## 2019-05-28 MED ORDER — IBUPROFEN 800 MG PO TABS: 800.0000 mg | ORAL_TABLET | Freq: Three times a day (TID) | ORAL | 0 refills | Status: DC

## 2019-05-28 MED ORDER — LIDOCAINE HCL (PF) 1 % IJ SOLN
INTRAMUSCULAR | Status: AC
  Filled 2019-05-28: qty 2

## 2019-05-28 MED ORDER — AMOXICILLIN 875 MG PO TABS: 875.0000 mg | ORAL_TABLET | Freq: Two times a day (BID) | ORAL | 0 refills | Status: DC

## 2019-05-28 MED ORDER — CEFTRIAXONE SODIUM 1 G IJ SOLR
1.0000 g | Freq: Once | INTRAMUSCULAR | Status: AC
  Administered 2019-05-28: 14:00:00 1 g via INTRAMUSCULAR

## 2019-05-28 MED ORDER — IBUPROFEN 800 MG PO TABS
800.0000 mg | ORAL_TABLET | Freq: Once | ORAL | Status: AC
  Administered 2019-05-28: 800 mg via ORAL

## 2019-05-28 NOTE — ED Triage Notes (Signed)
Pt presents with left side dental pain and facial swelling X 2 days with no relief with OTC medication.

## 2019-05-28 NOTE — ED Provider Notes (Addendum)
[facility]   MRN: [id number] DOB: 1975/07/17  Subjective:   Jean Ferguson is a 43 y.o. female presenting for 2 day hx of acute onset recurrent moderate-severe left sided upper jaw/dental pain. Patient last took amoxicillin clavulanate in August and was unable to get in with a dentist then.  She states that she got a severe yeast infection from taking Augmentin and is now saying she is allergic.  She has been using Tylenol with minimal relief.  Reports that her pain is severe, 10 out of 10, has difficulty opening her mouth and moving her jaw.  She is controlling her secretions and is able to speak clearly.  Denies fever, sinus pain, dizziness, chest pain, shortness of breath, heart racing, nausea, vomiting, belly pain.  No current facility-administered medications for this encounter.  Current Outpatient Medications:  .  amoxicillin-clavulanate (AUGMENTIN) 875-125 MG tablet, Take 1 tablet by mouth every 12 (twelve) hours., Disp: 14 tablet, Rfl: 0 .  cholecalciferol (VITAMIN D) 1000 units tablet, Take 1,000 Units by mouth daily., Disp: , Rfl:  .  cyclobenzaprine (FLEXERIL) 5 MG tablet, Take 1 tablet (5 mg total) by mouth at bedtime., Disp: 15 tablet, Rfl: 0 .  fluconazole (DIFLUCAN) 200 MG tablet, Take 1 tablet now and repeat in 72 hours, Disp: 2 tablet, Rfl: 0 .  IRON PO, Take 1 tablet by mouth daily., Disp: , Rfl:  .  meloxicam (MOBIC) 7.5 MG tablet, Take 1 tablet (7.5 mg total) by mouth daily., Disp: 20 tablet, Rfl: 0   Allergies  Allergen Reactions  . Augmentin [Amoxicillin-Pot Clavulanate]   . Morphine And Related Other (See Comments)    Pt states she feels like she is on fire inside    Past Medical History:  Diagnosis Date  . Anemia due to blood loss, chronic   . Hypertension   . Menorrhagia   . Uterine fibroid   . Wears glasses      Past Surgical History:  Procedure Laterality Date  . CESAREAN SECTION  x3   last one 1997   Bilateral Tubal Ligation w/ last one    . DILITATION & CURRETTAGE/HYSTROSCOPY WITH HYDROTHERMAL ABLATION N/A 03/30/2017   Procedure: DILATATION & CURETTAGE/HYSTEROSCOPY WITH HYDROTHERMAL ABLATION;  Surgeon: Donnamae Jude, MD;  Location: [facility];  Service: Gynecology;  Laterality: N/A;    Family History  Problem Relation Age of Onset  . Diabetes Mother   . Lung cancer Father   . Diabetes Father   . Heart attack Paternal Grandfather   . Stroke Paternal Uncle   . Diabetes Paternal Uncle   . Heart attack Paternal Uncle        x 3 brothers  . Stroke Paternal Aunt   . Diabetes Paternal Aunt   . Heart attack Paternal Aunt     Social History   Tobacco Use  . Smoking status: Former Smoker    Packs/day: 0.25    Years: 15.00    Pack years: 3.75    Types: Cigarettes  . Smokeless tobacco: Never Used  . Tobacco comment: quit August 08, 2018  Substance Use Topics  . Alcohol use: Yes    Comment: occasional  . Drug use: No    ROS As above.  Objective:   Vitals: BP (!) 168/96 (BP Location: Right Arm)   Pulse (!) 111   Temp 99.2 F (37.3 C) (Oral)   Resp 20   SpO2 100%   Pulse 98 on recheck.  Physical Exam Constitutional:  General: She is in acute distress (From her dental pain).     Appearance: Normal appearance. She is well-developed. She is ill-appearing. She is not toxic-appearing or diaphoretic.  HENT:     Head: Normocephalic and atraumatic.      Nose: Nose normal.     Mouth/Throat:     Mouth: Mucous membranes are moist.     Dentition: Abnormal dentition. Dental tenderness and dental caries present.   Eyes:     Extraocular Movements: Extraocular movements intact.     Pupils: Pupils are equal, round, and reactive to light.  Cardiovascular:     Rate and Rhythm: Regular rhythm. Tachycardia present.     Pulses: Normal pulses.     Heart sounds: Normal heart sounds. No murmur. No friction rub. No gallop.   Pulmonary:     Effort: Pulmonary effort is normal. No respiratory distress.      Breath sounds: Normal breath sounds. No stridor. No wheezing, rhonchi or rales.  Skin:    General: Skin is warm and dry.     Findings: No rash.  Neurological:     Mental Status: She is alert and oriented to person, place, and time.  Psychiatric:        Mood and Affect: Mood normal.        Behavior: Behavior normal.        Thought Content: Thought content normal.      Assessment and Plan :   1. Dental abscess   2. Dental infection     Patient refused Augmentin, will use IM ceftriaxone in clinic and start amoxicillin for dental abscess, use ibuprofen for pain and inflammation.  Diflucan given to patient for antibiotic associated yeast infection.  Emphasized need for dental surgeon consult. Counseled patient on potential for adverse effects with medications prescribed/recommended today, strict ER and return-to-clinic precautions discussed, patient verbalized understanding.    Jaynee Eagles, PA-C 05/28/19 1423

## 2019-05-28 NOTE — Discharge Instructions (Addendum)
[facility] [phone] extension [phone]  Dr. Civils [phone] [address]  [facility] [phone] [address]  [facility] [phone] extension [phone] [place], [place], [address] First come first serve for the first 10 clients.  May do simple extractions only, no wisdom teeth or surgery.  You may try the second for Thursday of the month starting at 6:30 AM.  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  

## 2019-06-22 ENCOUNTER — Other Ambulatory Visit: Payer: BC Managed Care – PPO

## 2019-06-23 ENCOUNTER — Ambulatory Visit: Payer: BC Managed Care – PPO | Attending: Internal Medicine

## 2019-06-23 DIAGNOSIS — Z20822 Contact with and (suspected) exposure to covid-19: Secondary | ICD-10-CM

## 2019-06-24 ENCOUNTER — Ambulatory Visit (HOSPITAL_COMMUNITY)
Admission: EM | Admit: 2019-06-24 | Discharge: 2019-06-24 | Disposition: A | Discharge status: Home / Self Care | Payer: BC Managed Care – PPO | Attending: Family Medicine | Admitting: Family Medicine

## 2019-06-24 ENCOUNTER — Other Ambulatory Visit: Payer: Self-pay

## 2019-06-24 ENCOUNTER — Encounter (HOSPITAL_COMMUNITY): Payer: Self-pay

## 2019-06-24 DIAGNOSIS — J069 Acute upper respiratory infection, unspecified: Secondary | ICD-10-CM

## 2019-06-24 DIAGNOSIS — R05 Cough: Secondary | ICD-10-CM

## 2019-06-24 DIAGNOSIS — I10 Essential (primary) hypertension: Secondary | ICD-10-CM

## 2019-06-24 LAB — NOVEL CORONAVIRUS, NAA: SARS-CoV-2, NAA: NOT DETECTED

## 2019-06-24 MED ORDER — DOXYCYCLINE HYCLATE 100 MG PO CAPS: 100.0000 mg | ORAL_CAPSULE | Freq: Two times a day (BID) | ORAL | 0 refills | Status: AC

## 2019-06-24 NOTE — Discharge Instructions (Addendum)
COVID negative Continue mucinex, add in daily cetirizine/loratadine May fill doxycycline on Wednesday if not improving or worsening Follow up if any symptoms not improving or worsening

## 2019-06-24 NOTE — ED Triage Notes (Addendum)
Patient presents to Urgent Care with complaints of sinus congestion since a week ago. 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  Pt requesting work note.

## 2019-06-24 NOTE — ED Provider Notes (Signed)
[facility]    CSN: [id number] Arrival date & time: 06/24/19  1239      History   Chief Complaint Chief Complaint  Patient presents with  . Letter for School/Work    HPI Jean Ferguson is a 44 y.o. female no contributing past medical history presenting today for evaluation of nasal congestion.  Patient states that over the past 5 days she has had sinus congestion and drainage.  She has been using Mucinex with improvement of symptoms.  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  She had Covid test yesterday which did return negative today.  She denies any fevers, fatigue.  Denies GI upset.  HPI  Past Medical History:  Diagnosis Date  . Anemia due to blood loss, chronic   . Hypertension   . Menorrhagia   . Uterine fibroid   . Wears glasses     Patient Active Problem List   Diagnosis Date Noted  . 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 Visual disturbance 02/25/2016  . Headache 02/25/2016  . 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 Menorrhagia 02/06/2016  . Fibroid uterus 01/27/2016    Past Surgical History:  Procedure Laterality Date  . CESAREAN SECTION  x3   last one 1997   Bilateral Tubal Ligation w/ last one  . DILITATION & CURRETTAGE/HYSTROSCOPY WITH HYDROTHERMAL ABLATION N/A 03/30/2017   Procedure: DILATATION & CURETTAGE/HYSTEROSCOPY WITH HYDROTHERMAL ABLATION;  Surgeon: Donnamae Jude, MD;  Location: [facility];  Service: Gynecology;  Laterality: N/A;    OB History    Gravida  3   Para  3   Term      Preterm  3   AB      Living  3     SAB      TAB      Ectopic      Multiple      Live Births  3            Home Medications    Prior to Admission medications   Medication Sig Start Date End Date Taking? Authorizing Provider  cholecalciferol (VITAMIN D) 1000 units tablet Take 1,000 Units by mouth daily.    [provider]  doxycycline (VIBRAMYCIN) 100 MG capsule Take 1 capsule (100 mg total) by mouth 2 (two) times daily for 7 days. 06/29/19 07/06/19  Cheynne Virden C, PA-C  famotidine (PEPCID) 20 MG tablet Take 1 tablet (20 mg total) by mouth 2 (two) times daily. Patient not taking: Reported on 03/17/2018 01/26/18 12/30/18  Carlisle Cater, PA-C    Family History Family History  Problem Relation Age of Onset  . Diabetes Mother   . Lung cancer Father   . Diabetes Father   . Heart attack Paternal Grandfather   . Stroke Paternal Uncle   . Diabetes Paternal Uncle   . Heart attack Paternal Uncle        x 3 brothers  . Stroke Paternal Aunt   . Diabetes Paternal Aunt   . Heart attack Paternal Aunt     Social History Social History   Tobacco Use  . Smoking status: Former Smoker    Packs/day: 0.25    Years: 15.00    Pack years: 3.75    Types: Cigarettes  . Smokeless tobacco: Never Used  . Tobacco comment: quit August 08, 2018  Substance Use Topics  . Alcohol use: Yes  Comment: occasional  . Drug use: No     Allergies   Augmentin [amoxicillin-pot clavulanate] and Morphine and related   Review of Systems Review of Systems  Constitutional: Negative for activity change, appetite change, chills, fatigue and fever.  HENT: Positive for congestion, rhinorrhea and sinus pressure. Negative for ear pain, sore throat and trouble swallowing.   Eyes: Negative for discharge and redness.  Respiratory: Negative for cough, chest tightness and shortness of breath.   Cardiovascular: Negative for chest pain.  Gastrointestinal: Negative for abdominal pain, diarrhea, nausea and vomiting.  Musculoskeletal: Negative for myalgias.  Skin: Negative for rash.  Neurological: Negative for dizziness, light-headedness and headaches.     Physical Exam Triage Vital Signs ED Triage Vitals  Enc Vitals Group     BP 06/24/19 1337 (!) 172/97     Pulse Rate 06/24/19 1337 67     Resp 06/24/19 1337 17     Temp  06/24/19 1337 98.7 F (37.1 C)     Temp Source 06/24/19 1337 Oral     SpO2 06/24/19 1337 99 %     Weight --      Height --      Head Circumference --      Peak Flow --      Pain Score 06/24/19 1335 0     Pain Loc --      Pain Edu? --      Excl. in [place]? --    No data found.  Updated Vital Signs BP (!) 172/97 (BP Location: Right Arm)   Pulse 67   Temp 98.7 F (37.1 C) (Oral)   Resp 17   SpO2 99%   Visual Acuity Right Eye Distance:   Left Eye Distance:   Bilateral Distance:    Right Eye Near:   Left Eye Near:    Bilateral Near:     Physical Exam Vitals and nursing note reviewed.  Constitutional:      General: She is not in acute distress.    Appearance: She is well-developed.     Comments: Pleasant female, no acute distress  HENT:     Head: Normocephalic and atraumatic.     Ears:     Comments: Bilateral ears without tenderness to palpation of external auricle, tragus and mastoid, EAC's without erythema or swelling, TM's with good bony landmarks and cone of light. Non erythematous.     Nose:     Comments: Nasal mucosa pink, mildly swollen turbinates bilaterally    Mouth/Throat:     Comments: Oral mucosa pink and moist, no tonsillar enlargement or exudate. Posterior pharynx patent and nonerythematous, no uvula deviation or swelling. Normal phonation. Eyes:     Conjunctiva/sclera: Conjunctivae normal.  Cardiovascular:     Rate and Rhythm: Normal rate and regular rhythm.     Heart sounds: No murmur.  Pulmonary:     Effort: Pulmonary effort is normal. No respiratory distress.     Breath sounds: Normal breath sounds.     Comments: Breathing comfortably at rest, CTABL, no wheezing, rales or other adventitious sounds auscultated Abdominal:     Palpations: Abdomen is soft.     Tenderness: There is no abdominal tenderness.  Musculoskeletal:     Cervical back: Neck supple.  Skin:    General: Skin is warm and dry.  Neurological:     Mental Status: She is alert.       UC Treatments / Results  Labs (all labs ordered are listed, but only abnormal results are displayed)  Labs Reviewed - No data to display  EKG   Radiology No results found.  Procedures Procedures (including critical care time)  Medications Ordered in UC Medications - No data to display  Initial Impression / Assessment and Plan / UC Course  I have reviewed the triage vital signs and the nursing notes.  Pertinent labs & imaging results that were available during my care of the patient were reviewed by me and considered in my medical decision making (see chart for details).     Covid PCR from yesterday negative.  Most likely viral URI recommending continued symptomatic and supportive care if symptoms worsen 10 days.  Did provide prescription for doxycycline to fill on day 10 if not having any improvement of symptoms or symptoms worsening.  Has allergy to Augmentin.  Work note provided.  Discussed strict return precautions. Patient verbalized understanding and is agreeable with plan.  Final Clinical Impressions(s) / UC Diagnoses   Final diagnoses:  Viral URI with cough     Discharge Instructions     COVID negative Continue mucinex, add in daily cetirizine/loratadine May fill doxycycline on Wednesday if not improving or worsening Follow up if any symptoms not improving or worsening   ED Prescriptions    Medication Sig Dispense Auth. Provider   doxycycline (VIBRAMYCIN) 100 MG capsule Take 1 capsule (100 mg total) by mouth 2 (two) times daily for 7 days. 14 capsule Kooper Godshall, [place] C, PA-C     PDMP not reviewed this encounter.   Janith Lima, [place] 06/24/19 1432

## 2019-07-26 ENCOUNTER — Other Ambulatory Visit: Payer: Self-pay

## 2019-07-26 ENCOUNTER — Encounter (HOSPITAL_COMMUNITY): Payer: Self-pay | Admitting: Emergency Medicine

## 2019-07-26 ENCOUNTER — Emergency Department (HOSPITAL_COMMUNITY)
Admission: EM | Admit: 2019-07-26 | Discharge: 2019-07-27 | Disposition: A | Discharge status: Home / Self Care | Payer: BC Managed Care – PPO | Attending: Emergency Medicine | Admitting: Emergency Medicine

## 2019-07-26 DIAGNOSIS — D219 Benign neoplasm of connective and other soft tissue, unspecified: Secondary | ICD-10-CM | POA: Insufficient documentation

## 2019-07-26 DIAGNOSIS — Z79899 Other long term (current) drug therapy: Secondary | ICD-10-CM | POA: Insufficient documentation

## 2019-07-26 DIAGNOSIS — Z87891 Personal history of nicotine dependence: Secondary | ICD-10-CM | POA: Diagnosis not present

## 2019-07-26 DIAGNOSIS — I10 Essential (primary) hypertension: Secondary | ICD-10-CM | POA: Diagnosis not present

## 2019-07-26 DIAGNOSIS — R1030 Lower abdominal pain, unspecified: Secondary | ICD-10-CM | POA: Insufficient documentation

## 2019-07-26 LAB — URINALYSIS, ROUTINE W REFLEX MICROSCOPIC
Bacteria, UA: NONE SEEN
Bilirubin Urine: NEGATIVE
Glucose, UA: NEGATIVE mg/dL
Ketones, ur: NEGATIVE mg/dL
Leukocytes,Ua: NEGATIVE
Nitrite: NEGATIVE
Protein, ur: NEGATIVE mg/dL
Specific Gravity, Urine: 1.015 (ref 1.005–1.030)
pH: 6 (ref 5.0–8.0)

## 2019-07-26 LAB — CBC
HCT: 41.4 % (ref 36.0–46.0)
Hemoglobin: 12.9 g/dL (ref 12.0–15.0)
MCH: 28.9 pg (ref 26.0–34.0)
MCHC: 31.2 g/dL (ref 30.0–36.0)
MCV: 92.6 fL (ref 80.0–100.0)
Platelets: 293 10*3/uL (ref 150–400)
RBC: 4.47 MIL/uL (ref 3.87–5.11)
RDW: 13.2 % (ref 11.5–15.5)
WBC: 9.3 10*3/uL (ref 4.0–10.5)
nRBC: 0 % (ref 0.0–0.2)

## 2019-07-26 LAB — COMPREHENSIVE METABOLIC PANEL
ALT: 13 U/L (ref 0–44)
AST: 19 U/L (ref 15–41)
Albumin: 3.7 g/dL (ref 3.5–5.0)
Alkaline Phosphatase: 59 U/L (ref 38–126)
Anion gap: 9 (ref 5–15)
BUN: 9 mg/dL (ref 6–20)
CO2: 25 mmol/L (ref 22–32)
Calcium: 8.8 mg/dL — ABNORMAL LOW (ref 8.9–10.3)
Chloride: 104 mmol/L (ref 98–111)
Creatinine, Ser: 0.82 mg/dL (ref 0.44–1.00)
GFR calc Af Amer: >60 mL/min (ref >60)
GFR calc non Af Amer: >60 mL/min (ref >60)
Glucose, Bld: 89 mg/dL (ref 70–99)
Potassium: 4.4 mmol/L (ref 3.5–5.1)
Sodium: 138 mmol/L (ref 135–145)
Total Bilirubin: 0.2 mg/dL — ABNORMAL LOW (ref 0.3–1.2)
Total Protein: 7.3 g/dL (ref 6.5–8.1)

## 2019-07-26 LAB — I-STAT BETA HCG BLOOD, ED (MC, WL, AP ONLY): I-stat hCG, quantitative: <5 m[IU]/mL (ref <5)

## 2019-07-26 LAB — LIPASE, BLOOD: Lipase: 23 U/L (ref 11–51)

## 2019-07-26 MED ORDER — SODIUM CHLORIDE 0.9% FLUSH: 3.0000 mL | Freq: Once | INTRAVENOUS | Status: DC

## 2019-07-26 NOTE — ED Triage Notes (Signed)
Pt reports lower left abdominal pain X2 weeks, pain is either throbbing or sore.  Denies n/v/diaharria.

## 2019-07-27 ENCOUNTER — Emergency Department (HOSPITAL_COMMUNITY): Payer: BC Managed Care – PPO

## 2019-07-27 ENCOUNTER — Encounter (HOSPITAL_COMMUNITY): Payer: Self-pay | Admitting: Radiology

## 2019-07-27 MED ORDER — IOHEXOL 300 MG/ML  SOLN
100.0000 mL | Freq: Once | INTRAMUSCULAR | Status: AC | PRN
  Administered 2019-07-27: 100 mL via INTRAVENOUS

## 2019-07-27 MED ORDER — HYDROCODONE-ACETAMINOPHEN 5-325 MG PO TABS: 1.0000 | ORAL_TABLET | ORAL | 0 refills | Status: DC | PRN

## 2019-07-27 MED ORDER — FENTANYL CITRATE (PF) 100 MCG/2ML IJ SOLN
50.0000 ug | Freq: Once | INTRAMUSCULAR | Status: AC
  Administered 2019-07-27: 01:00:00 50 ug via INTRAVENOUS
  Filled 2019-07-27: qty 2

## 2019-07-27 NOTE — ED Provider Notes (Signed)
[facility] EMERGENCY DEPARTMENT Provider Note   CSN: [id number] Arrival date & time: 07/26/19  1846     History Chief Complaint  Patient presents with  . Abdominal Pain    Jean Ferguson is a 44 y.o. female.  The history is provided by the patient and medical records.  Abdominal Pain   44 year old female with history of anemia, hypertension, menorrhagia, uterine fibroids, presenting to the ED with lower abdominal pain.  States this is actually been ongoing for about a month and a half now, but worse over the past 2 weeks.  Pain mostly localized to suprapubic and right lower abdomen.  She denies any radiation of the pain.  Pain will sometimes lessen in severity but never fully resolves.  States it is always a constant dull ache, when more intense she does have some sharp, stabbing pains.  She is not had any associated nausea, vomiting, diarrhea.  No urinary symptoms or vaginal issues.  She is status post uterine ablation.  Has had prior C-section, no other abdominal surgeries.  She has never had colonoscopy.  Generally no digestive or abdominal issues.  She has been eating and drinking well.  No fever or unintentional weight loss.  Past Medical History:  Diagnosis Date  . Anemia due to blood loss, chronic   . Hypertension   . Menorrhagia   . Uterine fibroid   . Wears glasses     Patient Active Problem List   Diagnosis Date Noted  . 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 Visual disturbance 02/25/2016  . Headache 02/25/2016  . 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 Menorrhagia 02/06/2016  . Fibroid uterus 01/27/2016    Past Surgical History:  Procedure Laterality Date  . CESAREAN SECTION  x3   last one 1997   Bilateral Tubal Ligation w/ last one  . DILITATION & CURRETTAGE/HYSTROSCOPY WITH HYDROTHERMAL ABLATION N/A 03/30/2017   Procedure: DILATATION & CURETTAGE/HYSTEROSCOPY WITH HYDROTHERMAL ABLATION;  Surgeon: Donnamae Jude, MD;  Location: [facility];  Service: Gynecology;  Laterality: N/A;     OB History    Gravida  3   Para  3   Term      Preterm  3   AB      Living  3     SAB      TAB      Ectopic      Multiple      Live Births  3           Family History  Problem Relation Age of Onset  . Diabetes Mother   . Lung cancer Father   . Diabetes Father   . Heart attack Paternal Grandfather   . Stroke Paternal Uncle   . Diabetes Paternal Uncle   . Heart attack Paternal Uncle        x 3 brothers  . Stroke Paternal Aunt   . Diabetes Paternal Aunt   . Heart attack Paternal Aunt     Social History   Tobacco Use  . Smoking status: Former Smoker    Packs/day: 0.25    Years: 15.00    Pack years: 3.75    Types: Cigarettes  . Smokeless tobacco: Never Used  . Tobacco comment: quit August 08, 2018  Substance Use Topics  . Alcohol use: Yes    Comment: occasional  . Drug use: No    Home Medications Prior to Admission medications   Medication Sig Start Date End Date Taking?  Authorizing Provider  cholecalciferol (VITAMIN D) 1000 units tablet Take 1,000 Units by mouth daily.    [provider]  famotidine (PEPCID) 20 MG tablet Take 1 tablet (20 mg total) by mouth 2 (two) times daily. Patient not taking: Reported on 03/17/2018 01/26/18 12/30/18  Carlisle Cater, PA-C    Allergies    Augmentin [amoxicillin-pot clavulanate] and Morphine and related  Review of Systems   Review of Systems  Gastrointestinal: Positive for abdominal pain.  All other systems reviewed and are negative.   Physical Exam Updated Vital Signs BP (!) 130/99 (BP Location: Right Arm)   Pulse 88   Temp 98.7 F (37.1 C) (Oral)   Resp 20   Ht 5\' 3"  (1.6 m)   Wt 78 kg   SpO2 100%   BMI 30.47 kg/m   Physical Exam Vitals and nursing note reviewed.  Constitutional:      Appearance: She is well-developed.  HENT:     Head: Normocephalic and atraumatic.  Eyes:     Conjunctiva/sclera: Conjunctivae normal.      Pupils: Pupils are equal, round, and reactive to light.  Cardiovascular:     Rate and Rhythm: Normal rate and regular rhythm.     Heart sounds: Normal heart sounds.  Pulmonary:     Effort: Pulmonary effort is normal.     Breath sounds: Normal breath sounds.  Abdominal:     General: Bowel sounds are normal.     Palpations: Abdomen is soft.     Tenderness: There is abdominal tenderness in the right lower quadrant and suprapubic area.  Musculoskeletal:        General: Normal range of motion.     Cervical back: Normal range of motion.  Skin:    General: Skin is warm and dry.  Neurological:     Mental Status: She is alert and oriented to person, place, and time.     ED Results / Procedures / Treatments   Labs (all labs ordered are listed, but only abnormal results are displayed) Labs Reviewed  COMPREHENSIVE METABOLIC PANEL - Abnormal; Notable for the following components:      Result Value   Calcium 8.8 (*)    Total Bilirubin 0.2 (*)    All other components within normal limits  URINALYSIS, ROUTINE W REFLEX MICROSCOPIC - Abnormal; Notable for the following components:   Hgb urine dipstick MODERATE (*)    All other components within normal limits  LIPASE, BLOOD  CBC  I-STAT BETA HCG BLOOD, ED ([facility], [facility], [facility] ONLY)    EKG None  Radiology CT ABDOMEN PELVIS W CONTRAST  Result Date: 07/27/2019 CLINICAL DATA:  Right lower quadrant and suprapubic pain for 1.5 months, worsening over last 2 weeks EXAM: CT ABDOMEN AND PELVIS WITH CONTRAST TECHNIQUE: Multidetector CT imaging of the abdomen and pelvis was performed using the standard protocol following bolus administration of intravenous contrast. CONTRAST:  173mL OMNIPAQUE IOHEXOL 300 MG/ML  SOLN COMPARISON:  08/19/2007 FINDINGS: Lower chest: No acute pleural or parenchymal lung disease. Hepatobiliary: No focal liver abnormality is seen. No gallstones, gallbladder wall thickening, or biliary dilatation. Pancreas: Unremarkable. No pancreatic  ductal dilatation or surrounding inflammatory changes. Spleen: Normal in size without focal abnormality. Adrenals/Urinary Tract: Adrenal glands are unremarkable. Kidneys are normal, without renal calculi, focal lesion, or hydronephrosis. Bladder is unremarkable. Stomach/Bowel: No bowel obstruction or ileus. Normal appendix right lower quadrant. No inflammatory changes. Vascular/Lymphatic: No significant vascular findings are present. No enlarged abdominal or pelvic lymph nodes. Reproductive: Uterus is enlarged  and heterogeneous consistent with multiple fibroids, stable. Normal follicles are seen within the left ovary. Right ovary is unremarkable. Other: No abdominal wall hernia or abnormality. No abdominopelvic ascites. Musculoskeletal: No acute or destructive bony lesions. Reconstructed images demonstrate no additional findings. IMPRESSION: 1. No acute intra-abdominal or intrapelvic process. Normal appendix. 2. Fibroid uterus, stable. Electronically Signed   By: Ferguson Ngo M.D.   On: 07/27/2019 01:51    Procedures Procedures (including critical care time)  Medications Ordered in ED Medications  sodium chloride flush (NS) 0.9 % injection 3 mL (has no administration in time range)  fentaNYL (SUBLIMAZE) injection 50 mcg (50 mcg Intravenous Given 07/27/19 0117)  iohexol (OMNIPAQUE) 300 MG/ML solution 100 mL (100 mLs Intravenous Contrast Given 07/27/19 0126)    ED Course  I have reviewed the triage vital signs and the nursing notes.  Pertinent labs & imaging results that were available during my care of the patient were reviewed by me and considered in my medical decision making (see chart for details).    MDM Rules/Calculators/A&P  44 year old female presenting to the ED with lower abdominal pain for the past month and a half, worse over the past 2 weeks.  No associated nausea, vomiting, diarrhea.  No urinary symptoms or vaginal complaints.  She is afebrile and nontoxic.  Some tenderness in the  suprapubic region and into the right lower quadrant.  Labs are all reassuring.  UA without any signs of infection.  CT scan was obtained, no acute findings, does have fibroid uterus.  Patient does report she had uterine ablation a few years ago due to heavy bleeding from her fibroids.  She has not really had much follow-up since that time.  We will have her follow-up closely with her GYN for follow-up, may benefit from ultrasounds as an  Outpatient.  Rx vicodin for night time use.  May return here for any new/acute changes.  Final Clinical Impression(s) / ED Diagnoses Final diagnoses:  Lower abdominal pain  Fibroids    Rx / DC Orders ED Discharge Orders         Ordered    HYDROcodone-acetaminophen (NORCO/VICODIN) 5-325 MG tablet  Every 4 hours PRN     07/27/19 0321           Larene Pickett, PA-C [id number]    Delora Fuel, MD [id number] [phone]

## 2019-07-27 NOTE — Discharge Instructions (Signed)
Take the prescribed medication as directed.  Do not drive while taking pain medicine. Follow-up with your OB-GYN, they may want to get you in for ultrasound. Return to the ED for new or worsening symptoms.

## 2019-07-27 NOTE — ED Notes (Signed)
Patient verbalizes understanding of discharge instructions. Opportunity for questioning and answers were provided. Armband removed by staff, pt discharged from ED.  

## 2019-08-23 ENCOUNTER — Other Ambulatory Visit: Payer: Self-pay | Admitting: Obstetrics and Gynecology

## 2019-08-27 IMAGING — CR DG SACRUM/COCCYX 2+V
3 series · 3 of 3 positions shown · non-contrast
Comparison: CT abdomen pelvis of 08/19/2007

CLINICAL DATA: Low back pain for 2 weeks, no injury

EXAM:
SACRUM AND COCCYX - 2+ VIEW

[t sacrum ap]
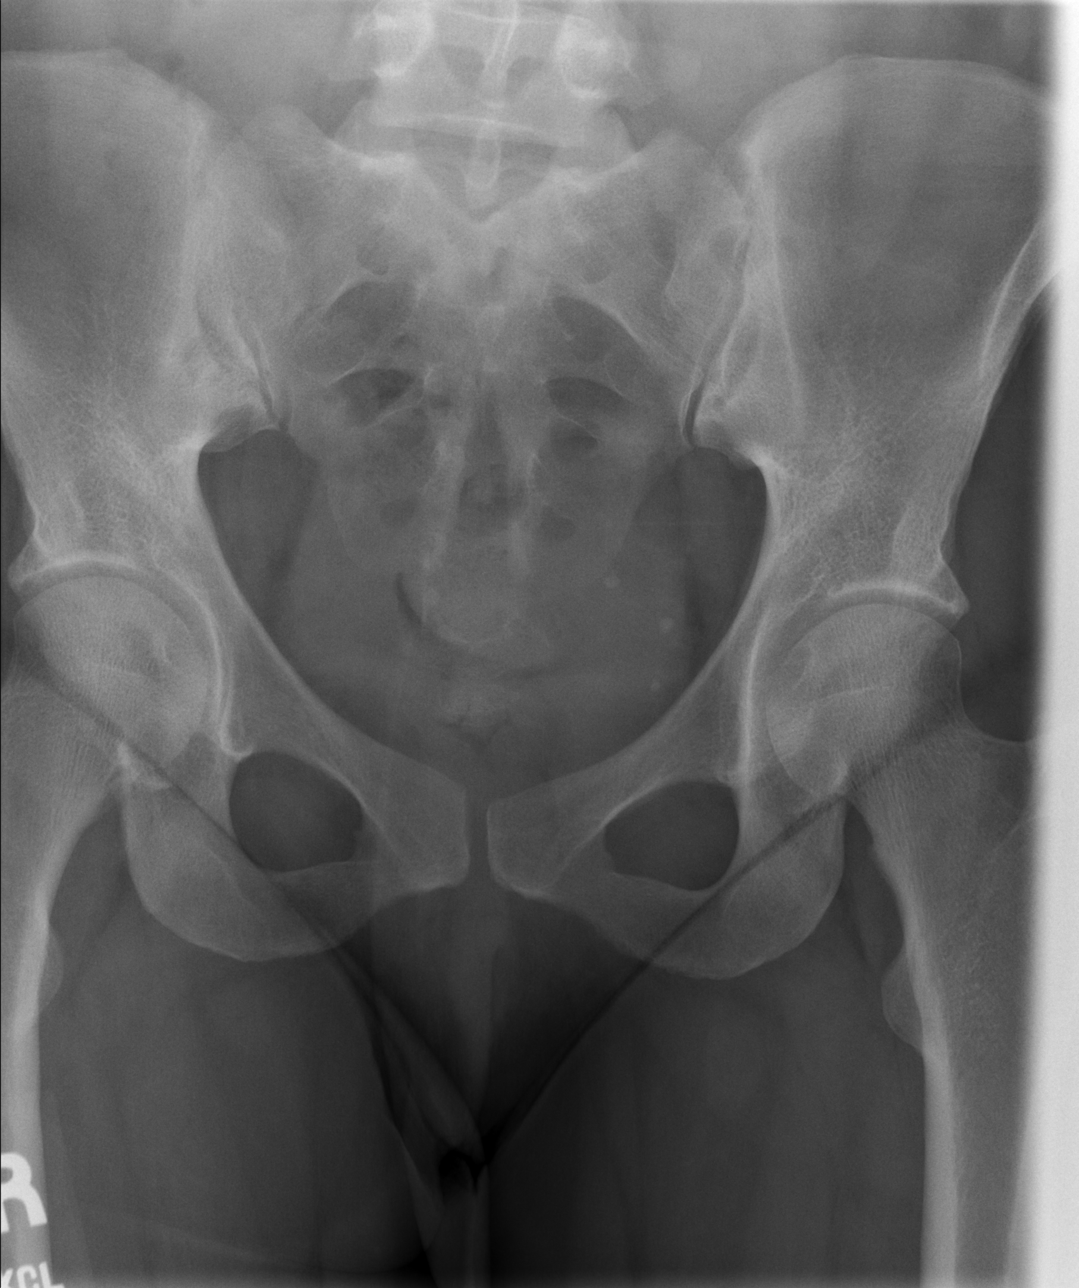

[t coccyx ap]
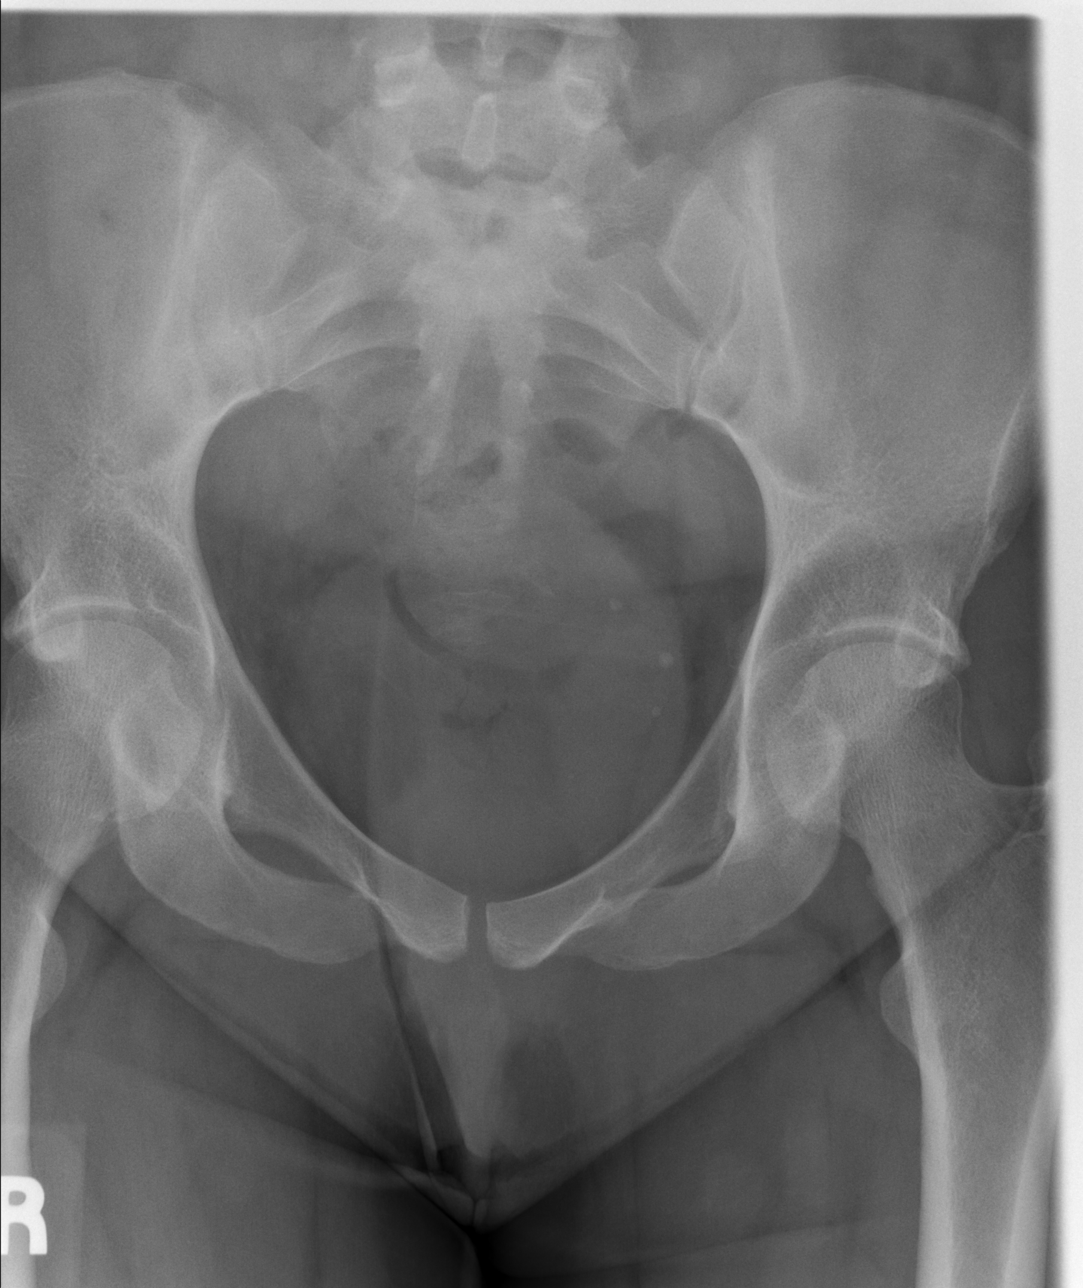

[t sacrum coccyx lat]
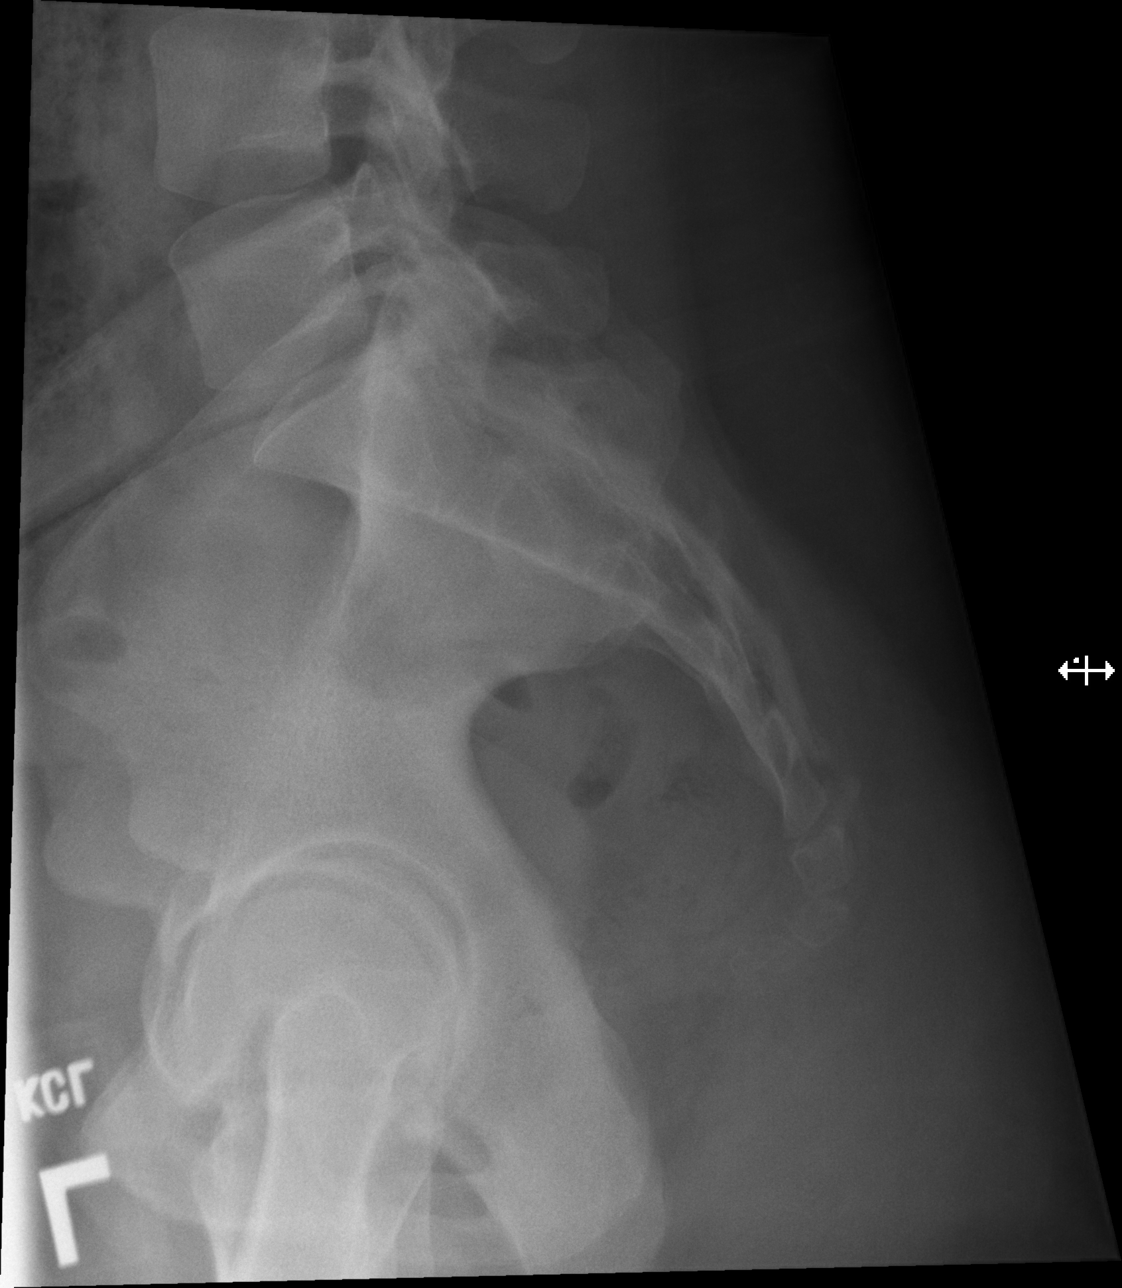

[3 of 3 positions shown; findings below may reference images not displayed]

FINDINGS: The sacrococcygeal elements are in normal alignment. No acute
abnormality is seen. Lower lumbar disc spaces appear normal. The SI
joints appear corticated. The pelvic rami are intact. Both hips
appear to be normally positioned..
IMPRESSION: Negative.

## 2019-08-27 IMAGING — CR DG LUMBAR SPINE COMPLETE 4+V
5 series · 5 of 5 positions shown · non-contrast
Comparison: CT abdomen pelvis of 08/19/2007

CLINICAL DATA: Pain in the low back for 2 weeks, no acute injury

EXAM:
LUMBAR SPINE - COMPLETE 4+ VIEW

[t lumbar spine ap]
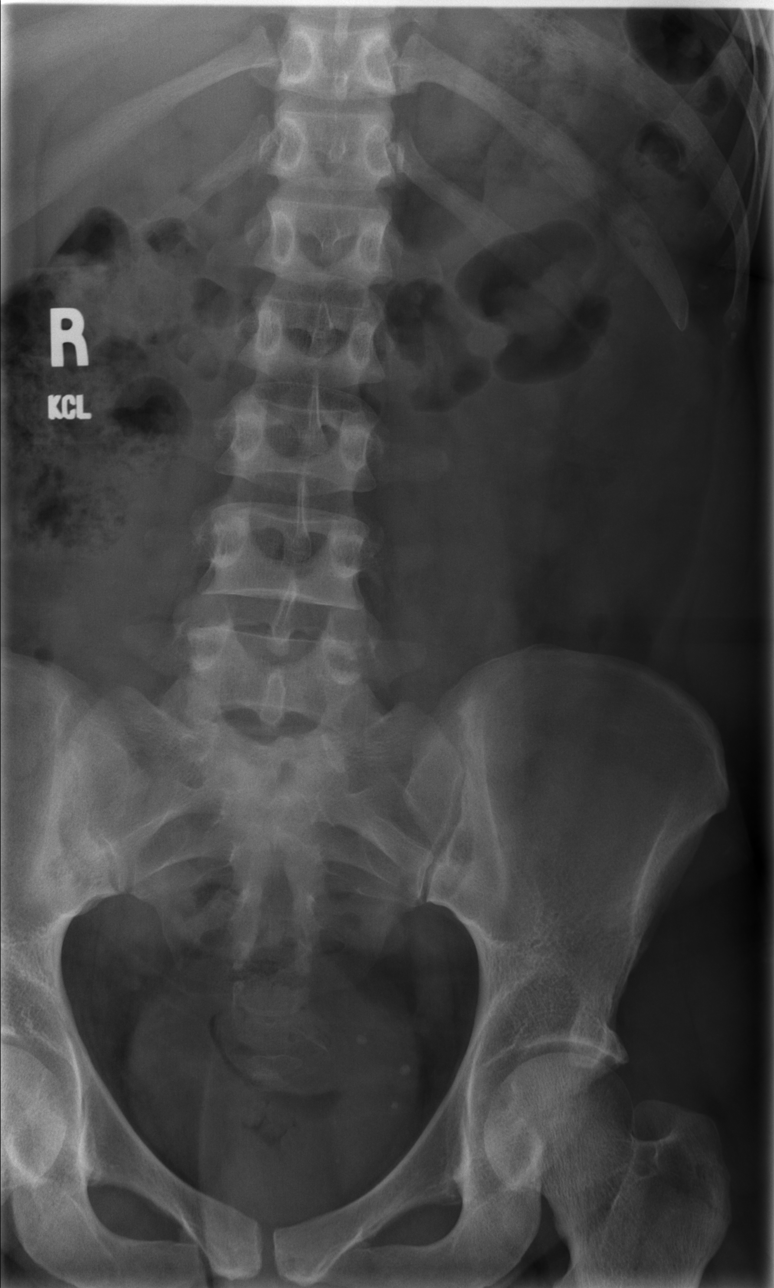

[t lumbar spine obl (1 of 2)]
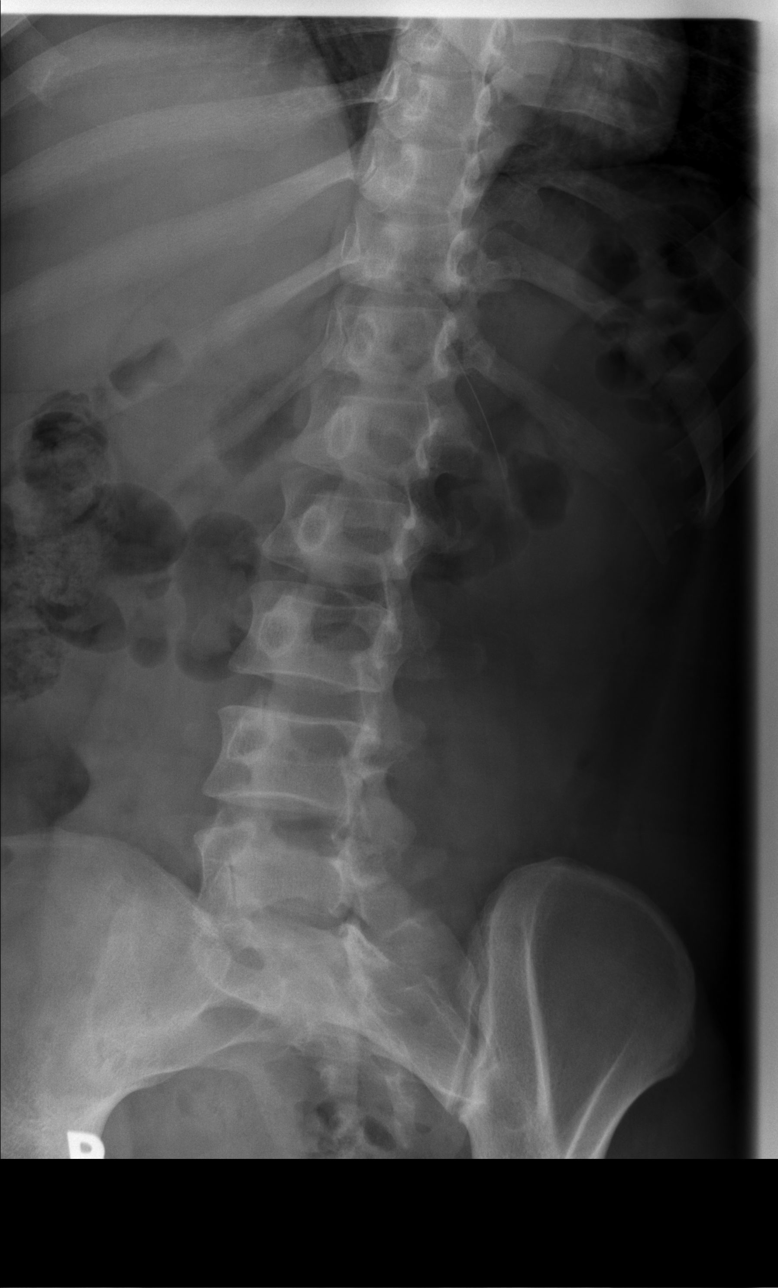

[t lumbar spine obl (2 of 2)]
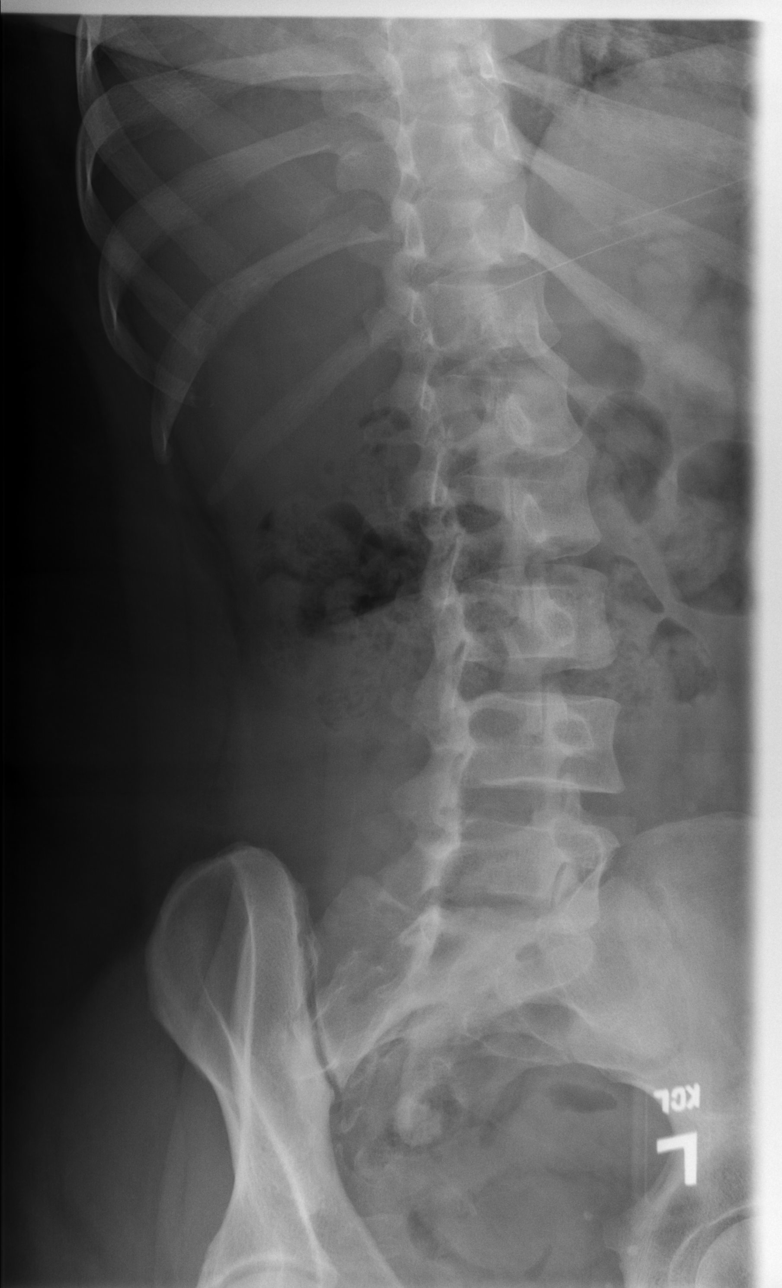

[t lumbar spine lat]
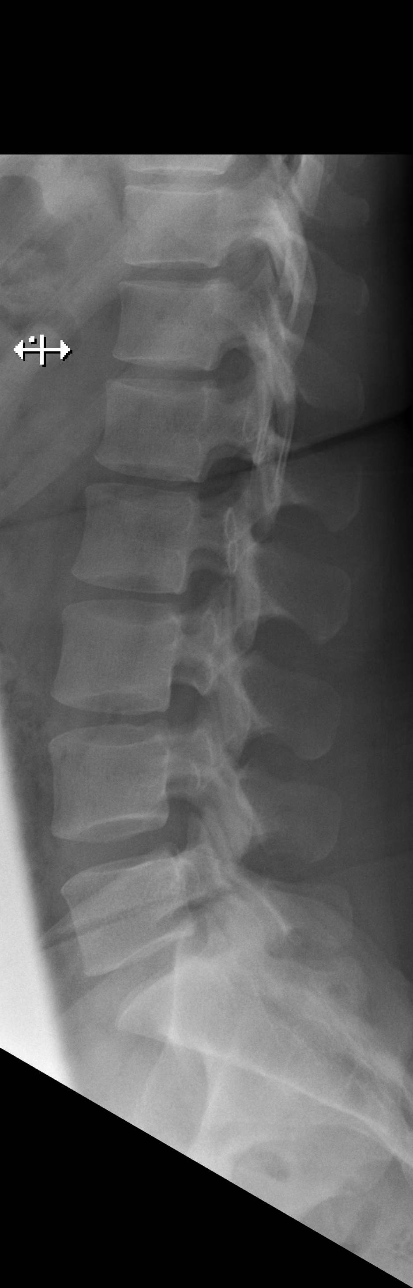

[t lumbar l-5 s-1 spot]
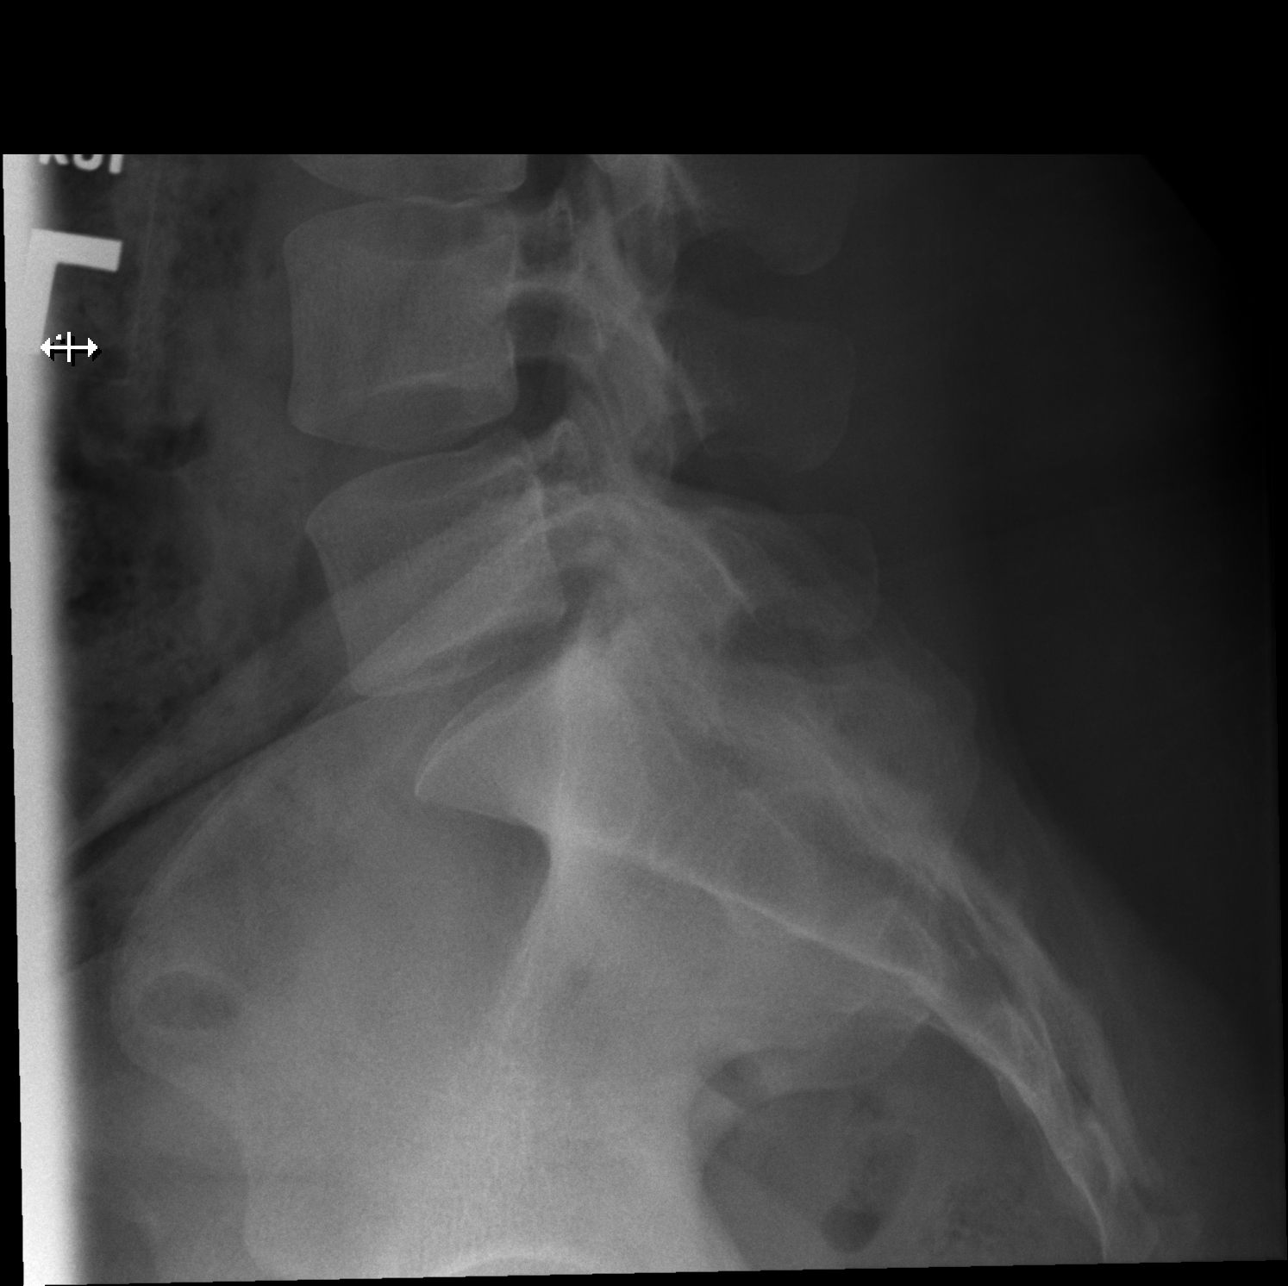

[5 of 5 positions shown; findings below may reference images not displayed]

FINDINGS: The lumbar vertebrae are unchanged in alignment. Intervertebral disc
spaces appear normal. No compression deformity is seen. There is
mild degenerative change of the facet joints of L5-S1. The SI joints
again appear corticated.
IMPRESSION: Normal alignment. No acute abnormality. Question of mild
degenerative change of the facet joints of L5-S1.

## 2019-09-01 NOTE — Progress Notes (Signed)
CAN YOU PLEASE PLACE SOME ORDERS FOR THE UPCOMING SURGERY.tHANK YOU.

## 2019-09-05 ENCOUNTER — Other Ambulatory Visit: Payer: Self-pay

## 2019-09-05 NOTE — H&P (Signed)
Jean Ferguson is a 44 y.o.  Jean Ferguson is a 44 YO female, P: 3-0-0-3 presenting for a hysterectomy because of pelvic pain. The patient is S/P Endometrial Ablation (2018) with complaints of a 2 month history of sharp pelvic pain that was intermittent, fleeting with episodes of achiness. Pain was rated 6/10 with only minutes between recurrences. She denies any changes in bowel or bladder function, dyspareunia or vaginitis symptoms. At the time of the preop visit the patient states she has been pain free for the past several weeks. A pelvic ultrasound showed an anteverted uterus: 7.17 x 5.44 x 5.72 cm, endometrium: 3.53 mm; #3 intramural fibroids: right fundal-3.70 cm, anterior fundal-1.37 cm and posterior mid-2.86 cm; right ovary- 2.99 cm and left ovary-2.80 cm. An endometrial biopsy done at preop visit returned no distinct endometrial tissue benign endocervical glandular epithelium and dysplastic squamous epithelium. A review of both medical and surgical management options were given to the patient however she has decided to proceed with definitive therapy in the form of hysterectomy.     Past Medical History   OB History: G: 3;  P: 3-0-0-3:  C-section: 1993, 1996 and 1997  GYN History: menarche:44 YO    [id number]    Contraception: Tubal Sterilization;   Denies history of abnormal PAP smear.   Last PAP smear: 2021-pending  Medical History:  Endometrial Polyp, Anemia, Hypertension and Uterine Fibroids  Surgical History: 1997 Tubal Sterilization and 2018 Hysteroscopy, D & C and Endometrial Ablation Denies problems with anesthesia or history of blood transfusions  Family History: Diabetes Mellitus,  Cancers (Lung, Throat, Brain and Breast), Hypertension, Heart Disease and Stroke.  Social History: Single and employed as a Scientist, clinical (histocompatibility and immunogenetics); Former smoker and denies alcohol use   Medications:  Allergies  Allergen Reactions  . Augmentin [Amoxicillin-Pot Clavulanate]   . Morphine And Related  Other (See Comments)    Pt states she feels like she is on fire inside    ROS: Admits to glasses but  denies headache, vision changes, nasal congestion, dysphagia, tinnitus, dizziness, hoarseness, cough,  chest pain, shortness of breath, nausea, vomiting, diarrhea,constipation,  urinary frequency, urgency  dysuria, hematuria, vaginitis symptoms,  swelling of joints,easy bruising,  arthralgias, skin rashes, unexplained weight loss and except as is mentioned in the history of present illness, patient's review of systems is otherwise negative.     Physical Exam  Bp: 126/66; P: 75 bpm; R: 16;  Temperature: 97.5 degrees F temporally;  Weight: 198 lbs.  Height: 5\' 3"   BMI: 35.1; O2Sat. 100% (room air)  Neck: supple without masses or thyromegaly Lungs: clear to auscultation Heart: regular rate and rhythm Abdomen: soft, non-tender and no organomegaly Pelvic:EGBUS- wnl; vagina-normal rugae; uterus-enlarged and irregular, appearing 10 weeks  size, cervix without lesions or motion tenderness; adnexae-no tenderness or masses Extremities:  no clubbing, cyanosis or edema   Assesment: Pelvic Pain                      Uterine Fibroids                      S/P Endometrial Ablation   Disposition:  A discussion was held with patient regarding the indication for her procedure(s) along with the risks, which include but are not limited [id number] to anesthesia, damage to adjacent organs (especially the bladder given her previous C-sections) , infection, excessive bleeding and the possible need for an open abdominal incision.  The patient verbalized understanding of these risks  and has consented to proceed with a Laparoscopically Assisted Vaginal Hysterectomy, Bilateral Salpingectomy, Possible Abdominal Hysterectomy and Cystoscopy at [facility] on September 20, 2019.   CSN# [id number]   Macguire Holsinger J. Florene Glen, PA-C  for Dr. Franklyn Lor. Dillard

## 2019-09-13 ENCOUNTER — Encounter (HOSPITAL_COMMUNITY): Payer: Self-pay

## 2019-09-13 ENCOUNTER — Other Ambulatory Visit: Payer: Self-pay

## 2019-09-13 ENCOUNTER — Encounter (HOSPITAL_COMMUNITY)
Admission: RE | Admit: 2019-09-13 | Discharge: 2019-09-13 | Disposition: A | Discharge status: Home / Self Care | Payer: BC Managed Care – PPO | Source: Ambulatory Visit | Attending: Obstetrics and Gynecology | Admitting: Obstetrics and Gynecology

## 2019-09-13 DIAGNOSIS — Z01812 Encounter for preprocedural laboratory examination: Secondary | ICD-10-CM | POA: Insufficient documentation

## 2019-09-13 LAB — BASIC METABOLIC PANEL
Anion gap: 10 (ref 5–15)
BUN: 11 mg/dL (ref 6–20)
CO2: 23 mmol/L (ref 22–32)
Calcium: 8.4 mg/dL — ABNORMAL LOW (ref 8.9–10.3)
Chloride: 104 mmol/L (ref 98–111)
Creatinine, Ser: 0.78 mg/dL (ref 0.44–1.00)
GFR calc Af Amer: >60 mL/min (ref >60)
GFR calc non Af Amer: >60 mL/min (ref >60)
Glucose, Bld: 90 mg/dL (ref 70–99)
Potassium: 3.4 mmol/L — ABNORMAL LOW (ref 3.5–5.1)
Sodium: 137 mmol/L (ref 135–145)

## 2019-09-13 LAB — CBC
HCT: 41 % (ref 36.0–46.0)
Hemoglobin: 12.8 g/dL (ref 12.0–15.0)
MCH: 28.9 pg (ref 26.0–34.0)
MCHC: 31.2 g/dL (ref 30.0–36.0)
MCV: 92.6 fL (ref 80.0–100.0)
Platelets: 267 10*3/uL (ref 150–400)
RBC: 4.43 MIL/uL (ref 3.87–5.11)
RDW: 13.9 % (ref 11.5–15.5)
WBC: 9.9 10*3/uL (ref 4.0–10.5)
nRBC: 0 % (ref 0.0–0.2)

## 2019-09-13 LAB — ABO/RH: ABO/RH(D): A POS

## 2019-09-13 NOTE — Progress Notes (Signed)
PCP - K. STRUP .  Cardiologist - dR. nAHSER pHILIP  Chest x-ray -  EKG - 02/12/19 Stress Test -  ECHO -  Cardiac Cath -   Sleep Study -  CPAP -   Fasting Blood Sugar -  Checks Blood Sugar _____ times a day  Blood Thinner Instructions: Aspirin Instructions: Last Dose:  Anesthesia review:   Patient denies shortness of breath, fever, cough and chest pain at PAT appointment   Patient verbalized understanding of instructions that were given to them at the PAT appointment. Patient was also instructed that they will need to review over the PAT instructions again at home before surgery.

## 2019-09-13 NOTE — Patient Instructions (Addendum)
DUE TO COVID-19 ONLY ONE VISITOR IS ALLOWED IN WAITING ROOM (VISITOR WILL HAVE A TEMPERATURE CHECK ON ARRIVAL AND MUST WEAR A FACE MASK THE ENTIRE TIME.)  ONCE YOU ARE ADMITTED TO YOUR PRIVATE ROOM, THE SAME ONE VISITOR IS ALLOWED TO VISIT DURING VISITING HOURS ONLY.  Your COVID swab testing is scheduled for:09/19/19 at 2:50 pm  , You must self quarantine after your testing per handout given to you at the testing site.  ([facility] up testing enter pre-surgical testing line)    Your procedure is scheduled on: 09/22/19  Report to [facility] AT: 9:15  A. M.   Call this number if you have problems the morning of surgery:[phone].   OUR ADDRESS IS [address] [place].  WE ARE LOCATED IN THE [place]                                   [facility].                                     REMEMBER: .    BRUSH YOUR TEETH THE MORNING OF SURGERY.  TAKE THESE MEDICATIONS MORNING OF SURGERY WITH A SIP OF WATER:  Amlodipine.  DO NOT WEAR JEWERLY, MAKE UP, OR NAIL POLISH.  DO NOT WEAR LOTIONS, POWDERS, PERFUMES/COLOGNE OR DEODORANT.  DO NOT SHAVE FOR 24 HOURS PRIOR TO DAY OF SURGERY.  CONTACTS, GLASSES, OR DENTURES MAY NOT BE WORN TO SURGERY.                                    [facility] IS NOT RESPONSIBLE  FOR ANY BELONGINGS.         YOU MAY BRING A SMALL OVERNIGHT BAG   BRING ALL PRESCRIPTION MEDICATIONS WITH YOU THE DAY OF SURGERY IN ORIGINAL CONTAINERS                                                               NO SOLID FOOD AFTER MIDNIGHT THE NIGHT PRIOR TO SURGERY. NOTHING BY MOUTH EXCEPT CLEAR LIQUIDS UNTIL: 8:00 am .   CLEAR LIQUID DIET   Foods Allowed                                                                     Foods Excluded  Coffee and tea, regular and decaf                             liquids that you cannot  Plain Jell-O any favor except red or purple  see through such as: Fruit ices (not with fruit pulp)                                     milk, soups, orange juice  Iced Popsicles                                    All solid food Carbonated beverages, regular and diet                                    Cranberry, grape and apple juices Sports drinks like Gatorade Lightly seasoned clear broth or consume(fat free) Sugar, honey syrup  Sample Menu Breakfast                                Lunch                                     Supper Cranberry juice                    Beef broth                            Chicken broth Jell-O                                     Grape juice                           Apple juice Coffee or tea                        Jell-O                                      Popsicle                                                Coffee or tea                        Coffee or tea  _____________________________________________________________________  [facility] - Preparing for Surgery Before surgery, you can play an important role.  Because skin is not sterile, your skin needs to be as free of germs as possible.  You can reduce the number of germs on your skin by washing with CHG (chlorahexidine gluconate) soap before surgery.  CHG is an antiseptic cleaner which kills germs and bonds with the skin to continue killing germs even after washing. Please DO NOT use if you have an allergy to CHG or antibacterial soaps.  If your skin becomes reddened/irritated stop using the CHG and inform your nurse when you arrive at Short Stay. Do not shave (including legs and underarms) for  at least 48 hours prior to the first CHG shower.  You may shave your face/neck. Please follow these instructions carefully:  1.  Shower with CHG Soap the night before surgery and the  morning of Surgery.  2.  If you choose to wash your hair, wash your hair first as usual with your  normal  shampoo.  3.  After you shampoo, rinse your hair and body thoroughly  to remove the  shampoo.                           4.  Use CHG as you would any other liquid soap.  You can apply chg directly  to the skin and wash                       Gently with a scrungie or clean washcloth.  5.  Apply the CHG Soap to your body ONLY FROM THE NECK DOWN.   Do not use on face/ open                           Wound or open sores. Avoid contact with eyes, ears mouth and genitals (private parts).                       Wash face,  Genitals (private parts) with your normal soap.             6.  Wash thoroughly, paying special attention to the area where your surgery  will be performed.  7.  Thoroughly rinse your body with warm water from the neck down.  8.  DO NOT shower/wash with your normal soap after using and rinsing off  the CHG Soap.                9.  Pat yourself dry with a clean towel.            10.  Wear clean pajamas.            11.  Place clean sheets on your bed the night of your first shower and do not  sleep with pets. Day of Surgery : Do not apply any lotions/deodorants the morning of surgery.  Please wear clean clothes to the hospital/surgery center.  FAILURE TO FOLLOW THESE INSTRUCTIONS MAY RESULT IN THE CANCELLATION OF YOUR SURGERY PATIENT SIGNATURE_________________________________  NURSE SIGNATURE__________________________________  ________________________________________________________________________

## 2019-09-19 ENCOUNTER — Other Ambulatory Visit (HOSPITAL_COMMUNITY)
Admission: RE | Admit: 2019-09-19 | Discharge: 2019-09-19 | Disposition: A | Discharge status: Home / Self Care | Payer: BC Managed Care – PPO | Source: Ambulatory Visit | Attending: Obstetrics and Gynecology | Admitting: Obstetrics and Gynecology

## 2019-09-19 DIAGNOSIS — Z20822 Contact with and (suspected) exposure to covid-19: Secondary | ICD-10-CM | POA: Diagnosis not present

## 2019-09-19 DIAGNOSIS — Z01812 Encounter for preprocedural laboratory examination: Secondary | ICD-10-CM | POA: Diagnosis present

## 2019-09-19 LAB — SARS CORONAVIRUS 2 (TAT 6-24 HRS): SARS Coronavirus 2: NEGATIVE

## 2019-09-22 ENCOUNTER — Observation Stay (HOSPITAL_BASED_OUTPATIENT_CLINIC_OR_DEPARTMENT_OTHER): Payer: BC Managed Care – PPO | Admitting: Anesthesiology

## 2019-09-22 ENCOUNTER — Encounter (HOSPITAL_BASED_OUTPATIENT_CLINIC_OR_DEPARTMENT_OTHER): Payer: Self-pay | Admitting: Obstetrics and Gynecology

## 2019-09-22 ENCOUNTER — Encounter (HOSPITAL_BASED_OUTPATIENT_CLINIC_OR_DEPARTMENT_OTHER)
Admission: RE | Disposition: A | Discharge status: Home / Self Care | Payer: Self-pay | Source: Home / Self Care | Attending: Obstetrics and Gynecology

## 2019-09-22 ENCOUNTER — Ambulatory Visit (HOSPITAL_BASED_OUTPATIENT_CLINIC_OR_DEPARTMENT_OTHER)
Admission: RE | Admit: 2019-09-22 | Discharge: 2019-09-23 | Disposition: A | Discharge status: Home / Self Care | Payer: BC Managed Care – PPO | Attending: Obstetrics and Gynecology | Admitting: Obstetrics and Gynecology

## 2019-09-22 DIAGNOSIS — Z880 Allergy status to penicillin: Secondary | ICD-10-CM | POA: Insufficient documentation

## 2019-09-22 DIAGNOSIS — N736 Female pelvic peritoneal adhesions (postinfective): Secondary | ICD-10-CM | POA: Diagnosis not present

## 2019-09-22 DIAGNOSIS — D251 Intramural leiomyoma of uterus: Secondary | ICD-10-CM | POA: Diagnosis not present

## 2019-09-22 DIAGNOSIS — Z6835 Body mass index (BMI) 35.0-35.9, adult: Secondary | ICD-10-CM | POA: Insufficient documentation

## 2019-09-22 DIAGNOSIS — Z881 Allergy status to other antibiotic agents status: Secondary | ICD-10-CM | POA: Insufficient documentation

## 2019-09-22 DIAGNOSIS — N72 Inflammatory disease of cervix uteri: Secondary | ICD-10-CM | POA: Insufficient documentation

## 2019-09-22 DIAGNOSIS — Z8249 Family history of ischemic heart disease and other diseases of the circulatory system: Secondary | ICD-10-CM | POA: Diagnosis not present

## 2019-09-22 DIAGNOSIS — Z87891 Personal history of nicotine dependence: Secondary | ICD-10-CM | POA: Diagnosis not present

## 2019-09-22 DIAGNOSIS — D259 Leiomyoma of uterus, unspecified: Secondary | ICD-10-CM | POA: Diagnosis present

## 2019-09-22 DIAGNOSIS — E669 Obesity, unspecified: Secondary | ICD-10-CM | POA: Insufficient documentation

## 2019-09-22 DIAGNOSIS — I10 Essential (primary) hypertension: Secondary | ICD-10-CM | POA: Diagnosis not present

## 2019-09-22 DIAGNOSIS — Z885 Allergy status to narcotic agent status: Secondary | ICD-10-CM | POA: Diagnosis not present

## 2019-09-22 DIAGNOSIS — R102 Pelvic and perineal pain: Secondary | ICD-10-CM | POA: Diagnosis present

## 2019-09-22 HISTORY — PX: LAPAROSCOPIC VAGINAL HYSTERECTOMY WITH SALPINGECTOMY: SHX6680

## 2019-09-22 LAB — POCT PREGNANCY, URINE: Preg Test, Ur: NEGATIVE

## 2019-09-22 LAB — TYPE AND SCREEN
ABO/RH(D): A POS
Antibody Screen: NEGATIVE

## 2019-09-22 SURGERY — HYSTERECTOMY, VAGINAL, LAPAROSCOPY-ASSISTED, WITH SALPINGECTOMY
Anesthesia: General | Site: Abdomen | Laterality: Bilateral

## 2019-09-22 MED ORDER — SUGAMMADEX SODIUM 200 MG/2ML IV SOLN
INTRAVENOUS | Status: DC | PRN
  Administered 2019-09-22: 180 mg via INTRAVENOUS

## 2019-09-22 MED ORDER — DEXAMETHASONE SODIUM PHOSPHATE 10 MG/ML IJ SOLN
INTRAMUSCULAR | Status: DC | PRN
  Administered 2019-09-22: 10 mg via INTRAVENOUS

## 2019-09-22 MED ORDER — HYDROCHLOROTHIAZIDE 12.5 MG PO TABS
12.5000 mg | ORAL_TABLET | Freq: Every morning | ORAL | Status: DC
  Administered 2019-09-22: 12.5 mg via ORAL
  Filled 2019-09-22: qty 1

## 2019-09-22 MED ORDER — DEXAMETHASONE SODIUM PHOSPHATE 10 MG/ML IJ SOLN
INTRAMUSCULAR | Status: AC
  Filled 2019-09-22: qty 1

## 2019-09-22 MED ORDER — PROPOFOL 10 MG/ML IV BOLUS
INTRAVENOUS | Status: DC | PRN
  Administered 2019-09-22: 200 mg via INTRAVENOUS

## 2019-09-22 MED ORDER — MIDAZOLAM HCL 2 MG/2ML IJ SOLN
INTRAMUSCULAR | Status: AC
  Filled 2019-09-22: qty 2

## 2019-09-22 MED ORDER — BUPIVACAINE HCL (PF) 0.25 % IJ SOLN: INTRAMUSCULAR | Status: DC | PRN

## 2019-09-22 MED ORDER — SIMETHICONE 80 MG PO CHEW: 80.0000 mg | CHEWABLE_TABLET | Freq: Four times a day (QID) | ORAL | Status: DC | PRN

## 2019-09-22 MED ORDER — ROCURONIUM BROMIDE 10 MG/ML (PF) SYRINGE
PREFILLED_SYRINGE | INTRAVENOUS | Status: AC
  Filled 2019-09-22: qty 10

## 2019-09-22 MED ORDER — ONDANSETRON HCL 4 MG/2ML IJ SOLN: 4.0000 mg | Freq: Four times a day (QID) | INTRAMUSCULAR | Status: DC | PRN

## 2019-09-22 MED ORDER — GENTAMICIN SULFATE 40 MG/ML IJ SOLN
5.0000 mg/kg | INTRAVENOUS | Status: AC
  Administered 2019-09-22: 12:00:00 390 mg via INTRAVENOUS
  Filled 2019-09-22: qty 9.75

## 2019-09-22 MED ORDER — SODIUM CHLORIDE (PF) 0.9 % IJ SOLN
INTRAMUSCULAR | Status: AC
  Filled 2019-09-22: qty 10

## 2019-09-22 MED ORDER — ONDANSETRON HCL 4 MG PO TABS: 4.0000 mg | ORAL_TABLET | Freq: Four times a day (QID) | ORAL | Status: DC | PRN

## 2019-09-22 MED ORDER — VASOPRESSIN 20 UNIT/ML IV SOLN
INTRAVENOUS | Status: DC | PRN
  Administered 2019-09-22: 28 mL via INTRAMUSCULAR

## 2019-09-22 MED ORDER — PROPOFOL 10 MG/ML IV BOLUS
INTRAVENOUS | Status: AC
  Filled 2019-09-22: qty 20

## 2019-09-22 MED ORDER — CLINDAMYCIN PHOSPHATE 900 MG/50ML IV SOLN
900.0000 mg | INTRAVENOUS | Status: AC
  Administered 2019-09-22: 900 mg via INTRAVENOUS

## 2019-09-22 MED ORDER — HEMOSTATIC AGENTS (NO CHARGE) OPTIME
TOPICAL | Status: DC | PRN
  Administered 2019-09-22: 1 via TOPICAL

## 2019-09-22 MED ORDER — FLUORESCEIN SODIUM 10 % IV SOLN
INTRAVENOUS | Status: AC
  Filled 2019-09-22: qty 5

## 2019-09-22 MED ORDER — FENTANYL CITRATE (PF) 250 MCG/5ML IJ SOLN
INTRAMUSCULAR | Status: DC | PRN
  Administered 2019-09-22: 50 ug via INTRAVENOUS
  Administered 2019-09-22: 100 ug via INTRAVENOUS
  Administered 2019-09-22 (×2): 50 ug via INTRAVENOUS

## 2019-09-22 MED ORDER — ROCURONIUM BROMIDE 10 MG/ML (PF) SYRINGE
PREFILLED_SYRINGE | INTRAVENOUS | Status: DC | PRN
  Administered 2019-09-22: 60 mg via INTRAVENOUS
  Administered 2019-09-22: 10 mg via INTRAVENOUS
  Administered 2019-09-22: 20 mg via INTRAVENOUS

## 2019-09-22 MED ORDER — KETOROLAC TROMETHAMINE 30 MG/ML IJ SOLN
INTRAMUSCULAR | Status: AC
  Filled 2019-09-22: qty 1

## 2019-09-22 MED ORDER — LIDOCAINE 2% (20 MG/ML) 5 ML SYRINGE
INTRAMUSCULAR | Status: AC
  Filled 2019-09-22: qty 5

## 2019-09-22 MED ORDER — FENTANYL CITRATE (PF) 250 MCG/5ML IJ SOLN
INTRAMUSCULAR | Status: AC
  Filled 2019-09-22: qty 5

## 2019-09-22 MED ORDER — OXYCODONE-ACETAMINOPHEN 5-325 MG PO TABS
ORAL_TABLET | ORAL | Status: AC
  Filled 2019-09-22: qty 1

## 2019-09-22 MED ORDER — LACTATED RINGERS IV SOLN: INTRAVENOUS | Status: DC

## 2019-09-22 MED ORDER — MENTHOL 3 MG MT LOZG
LOZENGE | OROMUCOSAL | Status: AC
  Filled 2019-09-22: qty 9

## 2019-09-22 MED ORDER — MIDAZOLAM HCL 2 MG/2ML IJ SOLN
INTRAMUSCULAR | Status: DC | PRN
  Administered 2019-09-22: 2 mg via INTRAVENOUS

## 2019-09-22 MED ORDER — LIDOCAINE 2% (20 MG/ML) 5 ML SYRINGE
INTRAMUSCULAR | Status: DC | PRN
  Administered 2019-09-22: 100 mg via INTRAVENOUS

## 2019-09-22 MED ORDER — FLUORESCEIN SODIUM 10 % IV SOLN
INTRAVENOUS | Status: DC | PRN
  Administered 2019-09-22: 1 mL via INTRAVENOUS

## 2019-09-22 MED ORDER — DOCUSATE SODIUM 100 MG PO CAPS
100.0000 mg | ORAL_CAPSULE | Freq: Two times a day (BID) | ORAL | Status: DC
  Administered 2019-09-22: 100 mg via ORAL

## 2019-09-22 MED ORDER — MENTHOL 3 MG MT LOZG: 1.0000 | LOZENGE | OROMUCOSAL | Status: DC | PRN

## 2019-09-22 MED ORDER — AMLODIPINE BESYLATE 5 MG PO TABS
5.0000 mg | ORAL_TABLET | Freq: Every day | ORAL | Status: DC
  Filled 2019-09-22: qty 1

## 2019-09-22 MED ORDER — CLINDAMYCIN PHOSPHATE 900 MG/50ML IV SOLN
INTRAVENOUS | Status: AC
  Filled 2019-09-22: qty 50

## 2019-09-22 MED ORDER — DOCUSATE SODIUM 100 MG PO CAPS
ORAL_CAPSULE | ORAL | Status: AC
  Filled 2019-09-22: qty 1

## 2019-09-22 MED ORDER — FENTANYL CITRATE (PF) 100 MCG/2ML IJ SOLN
INTRAMUSCULAR | Status: AC
  Filled 2019-09-22: qty 2

## 2019-09-22 MED ORDER — SODIUM CHLORIDE 0.9 % IR SOLN
Status: DC | PRN
  Administered 2019-09-22: 1000 mL
  Administered 2019-09-22: 1000 mL via INTRAVESICAL

## 2019-09-22 MED ORDER — KETOROLAC TROMETHAMINE 30 MG/ML IJ SOLN
INTRAMUSCULAR | Status: DC | PRN
  Administered 2019-09-22: 30 mg via INTRAVENOUS

## 2019-09-22 MED ORDER — ONDANSETRON HCL 4 MG/2ML IJ SOLN
INTRAMUSCULAR | Status: AC
  Filled 2019-09-22: qty 2

## 2019-09-22 MED ORDER — PROPOFOL 10 MG/ML IV BOLUS
INTRAVENOUS | Status: AC
  Filled 2019-09-22: qty 40

## 2019-09-22 MED ORDER — KETOROLAC TROMETHAMINE 30 MG/ML IJ SOLN
30.0000 mg | Freq: Four times a day (QID) | INTRAMUSCULAR | Status: DC
  Administered 2019-09-22 – 2019-09-23 (×3): 30 mg via INTRAVENOUS

## 2019-09-22 MED ORDER — OXYCODONE-ACETAMINOPHEN 5-325 MG PO TABS
1.0000 | ORAL_TABLET | ORAL | Status: DC | PRN
  Administered 2019-09-22: 2 via ORAL
  Administered 2019-09-22 – 2019-09-23 (×3): 1 via ORAL

## 2019-09-22 MED ORDER — BUPIVACAINE HCL (PF) 0.25 % IJ SOLN
INTRAMUSCULAR | Status: DC | PRN
  Administered 2019-09-22: 9 mL

## 2019-09-22 MED ORDER — ESTRADIOL 0.1 MG/GM VA CREA
TOPICAL_CREAM | VAGINAL | Status: DC | PRN
  Administered 2019-09-22: 1 via VAGINAL

## 2019-09-22 SURGICAL SUPPLY — 58 items
APPLICATOR ARISTA FLEXITIP XL (MISCELLANEOUS) ×3 IMPLANT
CABLE HIGH FREQUENCY MONO STRZ (ELECTRODE) IMPLANT
COVER BACK TABLE 60X90IN (DRAPES) ×3 IMPLANT
COVER MAYO STAND STRL (DRAPES) ×3 IMPLANT
DECANTER SPIKE VIAL GLASS SM (MISCELLANEOUS) IMPLANT
DERMABOND ADVANCED (GAUZE/BANDAGES/DRESSINGS) ×2
DERMABOND ADVANCED .7 DNX12 (GAUZE/BANDAGES/DRESSINGS) ×1 IMPLANT
DRAPE SHEET LG 3/4 BI-LAMINATE (DRAPES) ×6 IMPLANT
DRSG OPSITE POSTOP 3X4 (GAUZE/BANDAGES/DRESSINGS) ×3 IMPLANT
DURAPREP 26ML APPLICATOR (WOUND CARE) ×3 IMPLANT
ELECT REM PT RETURN 9FT ADLT (ELECTROSURGICAL)
ELECTRODE REM PT RTRN 9FT ADLT (ELECTROSURGICAL) IMPLANT
FORCEPS CUTTING 33CM 5MM (CUTTING FORCEPS) IMPLANT
FORCEPS CUTTING 45CM 5MM (CUTTING FORCEPS) ×3 IMPLANT
GAUZE PACKING 2X5 YD STRL (GAUZE/BANDAGES/DRESSINGS) IMPLANT
GAUZE VASELINE 3X9 (GAUZE/BANDAGES/DRESSINGS) IMPLANT
GLOVE BIO SURGEON STRL SZ 6.5 (GLOVE) ×4 IMPLANT
GLOVE BIO SURGEONS STRL SZ 6.5 (GLOVE) ×2
GLOVE BIOGEL PI IND STRL 6.5 (GLOVE) ×1 IMPLANT
GLOVE BIOGEL PI IND STRL 7.0 (GLOVE) ×4 IMPLANT
GLOVE BIOGEL PI INDICATOR 6.5 (GLOVE) ×2
GLOVE BIOGEL PI INDICATOR 7.0 (GLOVE) ×8
HEMOSTAT ARISTA ABSORB 3G PWDR (HEMOSTASIS) ×3 IMPLANT
IV NS IRRIG 3000ML ARTHROMATIC (IV SOLUTION) ×3 IMPLANT
KIT TURNOVER CYSTO (KITS) ×3 IMPLANT
LEGGING LITHOTOMY PAIR STRL (DRAPES) ×3 IMPLANT
NEEDLE MAYO CATGUT SZ4 (NEEDLE) ×3 IMPLANT
NS IRRIG 1000ML POUR BTL (IV SOLUTION) ×3 IMPLANT
PACK LAVH (CUSTOM PROCEDURE TRAY) ×3 IMPLANT
PACK ROBOTIC GOWN (GOWN DISPOSABLE) ×3 IMPLANT
PACK TRENDGUARD 450 HYBRID PRO (MISCELLANEOUS) IMPLANT
PAD OB MATERNITY 4.3X12.25 (PERSONAL CARE ITEMS) ×3 IMPLANT
POUCH LAPAROSCOPIC INSTRUMENT (MISCELLANEOUS) ×3 IMPLANT
PROTECTOR NERVE ULNAR (MISCELLANEOUS) ×6 IMPLANT
SCISSORS LAP 5X35 DISP (ENDOMECHANICALS) ×3 IMPLANT
SET IRRIG TUBING LAPAROSCOPIC (IRRIGATION / IRRIGATOR) IMPLANT
SET IRRIG Y TYPE TUR BLADDER L (SET/KITS/TRAYS/PACK) ×3 IMPLANT
SET SUCTION IRRIG HYDROSURG (IRRIGATION / IRRIGATOR) ×3 IMPLANT
SET TUBE SMOKE EVAC HIGH FLOW (TUBING) ×3 IMPLANT
SHEARS HARMONIC ACE PLUS 36CM (ENDOMECHANICALS) IMPLANT
SOLUTION ELECTROLUBE (MISCELLANEOUS) IMPLANT
SUT CHROMIC 0 CT 1 (SUTURE) ×3 IMPLANT
SUT CHROMIC 0 UR 5 27 (SUTURE) IMPLANT
SUT MNCRL AB 3-0 PS2 27 (SUTURE) ×6 IMPLANT
SUT VIC AB 0 CT1 18XCR BRD8 (SUTURE) ×3 IMPLANT
SUT VIC AB 0 CT1 27 (SUTURE) ×2
SUT VIC AB 0 CT1 27XBRD ANBCTR (SUTURE) ×1 IMPLANT
SUT VIC AB 0 CT1 36 (SUTURE) ×3 IMPLANT
SUT VIC AB 0 CT1 8-18 (SUTURE) ×6
SUT VICRYL 0 TIES 12 18 (SUTURE) ×3 IMPLANT
SUT VICRYL 0 UR6 27IN ABS (SUTURE) ×3 IMPLANT
SYR 50ML LL SCALE MARK (SYRINGE) IMPLANT
SYR BULB IRRIGATION 50ML (SYRINGE) ×3 IMPLANT
TRAY FOLEY W/BAG SLVR 14FR (SET/KITS/TRAYS/PACK) ×3 IMPLANT
TRENDGUARD 450 HYBRID PRO PACK (MISCELLANEOUS)
TROCAR BALLN 12MMX100 BLUNT (TROCAR) ×3 IMPLANT
TROCAR BLADELESS OPT 5 100 (ENDOMECHANICALS) ×6 IMPLANT
WARMER LAPAROSCOPE (MISCELLANEOUS) ×3 IMPLANT

## 2019-09-22 NOTE — Op Note (Signed)
Preop Diagnosis: Symptomatic Fibroids, 58570, poss. [id number]   Postop Diagnosis: Symptomatic Fibroids   Procedure: LAVH, B salpingectmy LOA, Cystoscopy  Anesthesia: General   Anesthesiologist: see record  Attending: Betsy Coder, MD   Assistant: Earnstine Regal PA  Findings: 110week size fibroid uterus.  Uterine abdominal wall adhesions.  Normal adnexa B.   Normal appearing liver and gall bladder  Pathology: uterus and cervix and bilateral  partial tubes tubes  Fluids: 3200 cccrystalloid  UOP: 200cc  EBL: [id number]  Complications:none  Procedure: The patient was taken to the operating room, placed under general anesthesia and prepped and draped in the normal sterile fashion. A Foley catheter was placed in the bladde. A weighted speculum and vaginal retractors were placed in the vagina. Tenaculum was placed on the anterior lip of the cervix.  A hulka manipulator was placed in the uterus. Attention was then turned to the abdomen. A 10 mm infraumbilical incision was made with the scalpel after 5 cc of 25% percent Marcaine was used for local anesthesia. The subcutaneous tissue was dissected and the fascia was incised with the knife. A purse string stitch was placed in the fascia and Hassan placed into the intra-abdominal cavity and anchored to the suture. Intraabdominal placement was confirmed with the laparoscope.  Two 5 mm trochars were placed in the right and left lower quadrants under direct visualization with the laparoscope.. thwe uterus was adherant to the abdominal wall and bladder.  Dense adhesions were lysed.  It took and hour .   Both round ligaments were cauterized and cut with the gyrus bipolar cautery as well and the bladder flap created with the tripolar and removed away from the uterus. Both portions of the  fallopian tubes was cauterized and removed.  The left ovary was adherent to the uterus.  The adhesions were lysed sharply and bluntly.  The left uterine ovarian ligament was then  cauterized and cut.    The right utero-ovarian ligament was cauterized and cut with the tripolar cautery gyrus.  LOA took approximately one hour. Attention was then turned to the vagina.  A weighted speculum was placed in the posterior fourchette.  petrussin mixture was placed circumferentially around the cervix.  With blunt and sharp dissection the cervix was dissected away from the bowel and bladder.  Both uterosacral ligaments were clamped, cut and suture ligated and held.  The anterior and posterior culdesac was entered sharply using metzenbaum scissors.  The cardinal ligaments and  The uterine arteries were clamped, cut and suture ligated bilaterally.    The uterus was then delivered.    The mcall suture was placed.   The vaginal cuff was closed with interrupted suture of 0 vicryl.   the patient was given fluroscein.    Cystoscopy was performed and both ureters were seen to efflux flurosceinwithout difficulty. The bladder had full integrity with no suture or laceration visualized.  The vagina was inspected and the cuff was noted to be intact.  Attention was then turned back to the abdomen after removing gloves and gown and putting on clean ones.  . The abdomen was reinsufflated with CO2 gas.   The abdomen and pelvis was copiously irrigated.  .     hemostasis was noted.  Arista was placed along the cuff.    All trochars were removed under direct visualization using the laparoscope.  The umbilical fascia was reapproximated by tying the circumferential suture. The two 5 mm incisions were closed with 3-0 Monocryl via a subcuticular stitch.  All remaining skin incisions were closed with Dermabond and the 10 mm skin incisions were reinforced using Dermabond. The vagina was packed and estrace cream was used on the packing.   Sponge lap and needle counts were correct.  The patient tolerated the procedure well and was returned to the PACU in stable condition

## 2019-09-22 NOTE — Hospital Course (Addendum)
Preop Diagnosis: Symptomatic Fibroids, 58570, poss. [id number]   Postop Diagnosis: Symptomatic Fibroids   Procedure: LAVH, B salpingectmy LOA, Cystoscopy  Anesthesia: General   Anesthesiologist: see record  Attending: Betsy Coder, MD   Assistant: Earnstine Regal PA  Findings: 110week size fibroid uterus.  Uterine abdominal wall adhesions.  Normal adnexa B.   Normal appearing liver and gall bladder  Pathology: uterus and cervix and bilateral  partial tubes tubes  Fluids: 3200 cccrystalloid  UOP: 200cc  EBL: [id number]  Complications:none  Procedure: The patient was taken to the operating room, placed under general anesthesia and prepped and draped in the normal sterile fashion. A Foley catheter was placed in the bladde. A weighted speculum and vaginal retractors were placed in the vagina. Tenaculum was placed on the anterior lip of the cervix.  A hulka manipulator was placed in the uterus. Attention was then turned to the abdomen. A 10 mm infraumbilical incision was made with the scalpel after 5 cc of 25% percent Marcaine was used for local anesthesia. The subcutaneous tissue was dissected and the fascia was incised with the knife. A purse string stitch was placed in the fascia and Hassan placed into the intra-abdominal cavity and anchored to the suture. Intraabdominal placement was confirmed with the laparoscope.  Two 5 mm trochars were placed in the right and left lower quadrants under direct visualization with the laparoscope.. thwe uterus was adherant to the abdominal wall and bladder.  Dense adhesions were lysed.  It took and hour .   Both round ligaments were cauterized and cut with the gyrus bipolar cautery as well and the bladder flap created with the tripolar and removed away from the uterus. Both portions of the  fallopian tubes was cauterized and removed.  The left ovary was adherent to the uterus.  The adhesions were lysed sharply and bluntly.  The left uterine ovarian ligament was then  cauterized and cut.    The right utero-ovarian ligament was cauterized and cut with the tripolar cautery gyrus.  LOA took approximately one hour. Attention was then turned to the vagina.  A weighted speculum was placed in the posterior fourchette.  petrussin mixture was placed circumferentially around the cervix.  With blunt and sharp dissection the cervix was dissected away from the bowel and bladder.  Both uterosacral ligaments were clamped, cut and suture ligated and held.  The anterior and posterior culdesac was entered sharply using metzenbaum scissors.  The cardinal ligaments and  The uterine arteries were clamped, cut and suture ligated bilaterally.    The uterus was then delivered.    The mcall suture was placed.   The vaginal cuff was closed with interrupted suture of 0 vicryl.   the patient was given fluroscein.    Cystoscopy was performed and both ureters were seen to efflux flurosceinwithout difficulty. The bladder had full integrity with no suture or laceration visualized.  The vagina was inspected and the cuff was noted to be intact.  Attention was then turned back to the abdomen after removing gloves and gown and putting on clean ones.  . The abdomen was reinsufflated with CO2 gas.   The abdomen and pelvis was copiously irrigated.  .     hemostasis was noted.  Arista was placed along the cuff.    All trochars were removed under direct visualization using the laparoscope.  The umbilical fascia was reapproximated by tying the circumferential suture. The two 5 mm incisions were closed with 3-0 Monocryl via a subcuticular stitch.  All remaining skin incisions were closed with Dermabond and the 10 mm skin incisions were reinforced using Dermabond. The vagina was packed and estrace cream was used on the packing.   Sponge lap and needle counts were correct.  The patient tolerated the procedure well and was returned to the PACU in stable condition

## 2019-09-22 NOTE — Anesthesia Procedure Notes (Signed)
Procedure Name: Intubation Date/Time: 09/22/2019 11:40 AM Performed by: Bonney Aid, CRNA Pre-anesthesia Checklist: Patient identified, Emergency Drugs available, Suction available and Patient being monitored Patient Re-evaluated:Patient Re-evaluated prior to induction Oxygen Delivery Method: Circle system utilized Preoxygenation: Pre-oxygenation with 100% oxygen Induction Type: IV induction Ventilation: Mask ventilation without difficulty Laryngoscope Size: Mac and 3 Grade View: Grade III Tube type: Oral Tube size: 7.0 mm Number of attempts: 1 Airway Equipment and Method: Stylet Placement Confirmation: ETT inserted through vocal cords under direct vision,  positive ETCO2 and breath sounds checked- equal and bilateral Secured at: 21 cm Tube secured with: Tape Dental Injury: Teeth and Oropharynx as per pre-operative assessment

## 2019-09-22 NOTE — Anesthesia Preprocedure Evaluation (Addendum)
Anesthesia Evaluation  Patient identified by MRN, date of birth, ID band Patient awake    Reviewed: Allergy & Precautions, NPO status , Patient's Chart, lab work & pertinent test results  History of Anesthesia Complications Negative for: history of anesthetic complications  Airway Mallampati: II  TM Distance: >3 FB Neck ROM: Full    Dental  (+) Dental Advisory Given, Poor Dentition, Chipped, Missing   Pulmonary Patient abstained from smoking., former smoker,    Pulmonary exam normal        Cardiovascular hypertension, Pt. on medications Normal cardiovascular exam     Neuro/Psych  Headaches, negative psych ROS   GI/Hepatic negative GI ROS, Neg liver ROS,   Endo/Other   Obesity   Renal/GU negative Renal ROS     Musculoskeletal negative musculoskeletal ROS (+)   Abdominal   Peds  Hematology negative hematology ROS (+)   Anesthesia Other Findings Covid neg 4/19   Reproductive/Obstetrics  Fibroid                             Anesthesia Physical Anesthesia Plan  ASA: II  Anesthesia Plan: General   Post-op Pain Management:    Induction: Intravenous  PONV Risk Score and Plan: 4 or greater and Treatment may vary due to age or medical condition, Ondansetron, Midazolam, Dexamethasone and Scopolamine patch - Pre-op  Airway Management Planned: Oral ETT  Additional Equipment: None  Intra-op Plan:   Post-operative Plan: Extubation in OR  Informed Consent: I have reviewed the patients History and Physical, chart, labs and discussed the procedure including the risks, benefits and alternatives for the proposed anesthesia with the patient or authorized representative who has indicated his/her understanding and acceptance.     Dental advisory given  Plan Discussed with: CRNA and Anesthesiologist  Anesthesia Plan Comments:        Anesthesia Quick Evaluation

## 2019-09-22 NOTE — Transfer of Care (Signed)
Immediate Anesthesia Transfer of Care Note  Patient: Jean Ferguson  Procedure(s) Performed: LAPAROSCOPIC ASSISTED VAGINAL HYSTERECTOMY WITH SALPINGECTOMY (Bilateral Abdomen)  Patient Location: PACU  Anesthesia Type:General  Level of Consciousness: drowsy  Airway & Oxygen Therapy: Patient Spontanous Breathing and Patient connected to nasal cannula oxygen  Post-op Assessment: Report given to RN  Post vital signs: Reviewed and stable  Last Vitals:  Vitals Value Taken Time  BP 126/86 09/22/19 1518  Temp 36.8 C 09/22/19 1518  Pulse 72 09/22/19 1523  Resp 20 09/22/19 1523  SpO2 95 % 09/22/19 1523  Vitals shown include unvalidated device data.  Last Pain:  Vitals:   09/22/19 0913  TempSrc: Oral  PainSc: 0-No pain      Patients Stated Pain Goal: 5 ([id number])  Complications: No apparent anesthesia complications

## 2019-09-23 DIAGNOSIS — D251 Intramural leiomyoma of uterus: Secondary | ICD-10-CM | POA: Diagnosis not present

## 2019-09-23 LAB — BASIC METABOLIC PANEL
Anion gap: 9 (ref 5–15)
BUN: 11 mg/dL (ref 6–20)
CO2: 24 mmol/L (ref 22–32)
Calcium: 8.6 mg/dL — ABNORMAL LOW (ref 8.9–10.3)
Chloride: 103 mmol/L (ref 98–111)
Creatinine, Ser: 0.97 mg/dL (ref 0.44–1.00)
GFR calc Af Amer: >60 mL/min (ref >60)
GFR calc non Af Amer: >60 mL/min (ref >60)
Glucose, Bld: 117 mg/dL — ABNORMAL HIGH (ref 70–99)
Potassium: 4.7 mmol/L (ref 3.5–5.1)
Sodium: 136 mmol/L (ref 135–145)

## 2019-09-23 LAB — CBC
HCT: 42.6 % (ref 36.0–46.0)
Hemoglobin: 13.8 g/dL (ref 12.0–15.0)
MCH: 29.6 pg (ref 26.0–34.0)
MCHC: 32.4 g/dL (ref 30.0–36.0)
MCV: 91.2 fL (ref 80.0–100.0)
Platelets: 294 10*3/uL (ref 150–400)
RBC: 4.67 MIL/uL (ref 3.87–5.11)
RDW: 13.9 % (ref 11.5–15.5)
WBC: 16.7 10*3/uL — ABNORMAL HIGH (ref 4.0–10.5)
nRBC: 0 % (ref 0.0–0.2)

## 2019-09-23 MED ORDER — IBUPROFEN 600 MG PO TABS
600.0000 mg | ORAL_TABLET | Freq: Four times a day (QID) | ORAL | 0 refills | Status: DC | PRN
Start: 2019-09-23 — End: 2021-05-29

## 2019-09-23 MED ORDER — OXYCODONE-ACETAMINOPHEN 5-325 MG PO TABS
ORAL_TABLET | ORAL | Status: AC
  Filled 2019-09-23: qty 1

## 2019-09-23 MED ORDER — KETOROLAC TROMETHAMINE 30 MG/ML IJ SOLN
INTRAMUSCULAR | Status: AC
  Filled 2019-09-23: qty 1

## 2019-09-23 MED ORDER — ONDANSETRON HCL 4 MG PO TABS: 4.0000 mg | ORAL_TABLET | Freq: Four times a day (QID) | ORAL | 0 refills | Status: DC | PRN

## 2019-09-23 MED ORDER — OXYCODONE-ACETAMINOPHEN 5-325 MG PO TABS: 1.0000 | ORAL_TABLET | ORAL | 0 refills | Status: AC | PRN

## 2019-09-23 MED ORDER — DOCUSATE SODIUM 100 MG PO CAPS: 100.0000 mg | ORAL_CAPSULE | Freq: Two times a day (BID) | ORAL | 0 refills | Status: AC

## 2019-09-23 NOTE — Discharge Instructions (Signed)
Call [facility] @ [phone] if:  You have a temperature greater than or equal to 100.4 degrees Farenheit orally You have pain that is not made better by the pain medication given and taken as directed You have excessive bleeding or problems urinating  Take Colace (Docusate Sodium/Stool Softener) 100 mg 2-3 times daily while taking narcotic pain medicine to avoid constipation or until bowel movements are regular. Take with food Ibuprofen 600 mg every 6 hours for 5 days then as needed for pain  You may drive after 2 weeks You may walk up steps  Total Laparoscopic Hysterectomy A total laparoscopic hysterectomy is a minimally invasive surgery to remove the uterus and cervix. The fallopian tubes and ovaries can also be removed (bilateral salpingo-oophorectomy) during this surgery, if necessary. This procedure may be done to treat problems such as:  Noncancerous growths in the uterus (uterine fibroids) that cause symptoms.  A condition that causes the lining of the uterus (endometrium) to grow in other areas (endometriosis).  Problems with pelvic support. This is caused by weakened muscles of the pelvis following vaginal childbirth or menopause.  Cancer of the cervix, ovaries, uterus, or endometrium.  Excessive (dysfunctional) uterine bleeding. This surgery is performed by inserting a thin, lighted tube (laparoscope) and surgical instruments into small incisions in the abdomen. The laparoscope sends images to a monitor. The images help the health care provider perform the procedure. After this procedure, you will no longer be able to have a baby, and you will no longer have a menstrual period. Tell a health care provider about:  Any allergies you have.  All medicines you are taking, including vitamins, herbs, eye drops, creams, and over-the-counter medicines.  Any problems you or family members have had with anesthetic medicines.  Any blood disorders you have.  Any  surgeries you have had.  Any medical conditions you have.  Whether you are pregnant or may be pregnant. What are the risks? Generally, this is a safe procedure. However, problems may occur, including:  Infection.  Bleeding.  Blood clots in the legs or lungs.  Allergic reactions to medicines.  Damage to other structures or organs.  The risk that the surgery may have to be switched to the regular one in which a large incision is made in the abdomen (abdominal hysterectomy). What happens before the procedure? Staying hydrated Follow instructions from your health care provider about hydration, which may include:  Up to 2 hours before the procedure - you may continue to drink clear liquids, such as water, clear fruit juice, black coffee, and plain tea Eating and drinking restrictions Follow instructions from your health care provider about eating and drinking, which may include:  8 hours before the procedure - stop eating heavy meals or foods such as meat, fried foods, or fatty foods.  6 hours before the procedure - stop eating light meals or foods, such as toast or cereal.  6 hours before the procedure - stop drinking milk or drinks that contain milk.  2 hours before the procedure - stop drinking clear liquids. Medicines  Ask your health care provider about: ? Changing or stopping your regular medicines. This is especially important if you are taking diabetes medicines or blood thinners. ? Taking over-the-counter medicines, vitamins, herbs, and supplements. ? Taking medicines such as aspirin and ibuprofen. These medicines can thin your blood. Do not take these medicines unless your health care provider tells you to take them.  You may be given antibiotic medicine to help prevent  infection.  You may be asked to take laxatives.  You may be given medicines to help prevent nausea and vomiting after the procedure. General instructions  Ask your health care provider how your  surgical site will be marked or identified.  You may be asked to shower with a germ-killing soap.  Do not use any products that contain nicotine or tobacco, such as cigarettes and e-cigarettes. If you need help quitting, ask your health care provider.  You may have an exam or testing, such as an ultrasound to determine the size and shape of your pelvic organs.  You may have a blood or urine sample taken.  This procedure can affect the way you feel about yourself. Talk with your health care provider about the physical and emotional changes hysterectomy may cause.  Plan to have someone take you home from the hospital or clinic.  Plan to have a responsible adult care for you for at least 24 hours after you leave the hospital or clinic. This is important. What happens during the procedure?  To lower your risk of infection: ? Your health care team will wash or sanitize their hands. ? Your skin will be washed with soap. ? Hair may be removed from the surgical area.  An IV will be inserted into one of your veins.  You will be given one or more of the following: ? A medicine to help you relax (sedative). ? A medicine to make you fall asleep (general anesthetic).  You will be given antibiotic medicine through your IV.  A tube may be inserted down your throat to help you breathe during the procedure.  A gas (carbon dioxide) will be used to inflate your abdomen to allow your surgeon to see inside of your abdomen.  Three or four small incisions will be made in your abdomen.  A laparoscope will be inserted into one of your incisions. Surgical instruments will be inserted through the other incisions in order to perform the procedure.  Your uterus and cervix may be removed through your vagina or cut into small pieces and removed through the small incisions. Any other organs that need to be removed will also be removed this way.  Carbon dioxide will be released from inside of your  abdomen.  Your incisions will be closed with stitches (sutures).  A bandage (dressing) may be placed over your incisions. The procedure may vary among health care providers and hospitals. What happens after the procedure?  Your blood pressure, heart rate, breathing rate, and blood oxygen level will be monitored until the medicines you were given have worn off.  You will be given medicine for pain and nausea as needed.  Do not drive for 24 hours if you received a sedative. Summary  Total Laparoscopic hysterectomy is a procedure to remove your uterus, cervix and sometimes the fallopian tubes and ovaries.  This procedure can affect the way you feel about yourself. Talk with your health care provider about the physical and emotional changes hysterectomy may cause.  After this procedure, you will no longer be able to have a baby, and you will no longer have a menstrual period.  You will be given pain medicine to control discomfort after this procedure. This information is not intended to replace advice given to you by your health care provider. Make sure you discuss any questions you have with your health care provider. Document Revised: 05/01/2017 Document Reviewed: 07/30/2016 Elsevier Patient Education  2020 [place] may shower  You may resume a regular diet  Keep incisions clean and dry Do not lift over 15 pounds for 6 weeks Avoid anything in vagina for 6 weeks (or until after your post-operative visit)

## 2019-09-23 NOTE — Discharge Summary (Signed)
Physician Discharge Summary  Patient ID: Jean Ferguson MRN: [id number] DOB/AGE: 07/17/1975 49 y.o.  Admit date: 09/22/2019 Discharge date: 09/23/2019  Admission Diagnoses:  Discharge Diagnoses:  Active Problems:   Pelvic pain in female   Discharged Condition: good  Hospital Course: Pt underwent LAVH , LOA, b salpingectomy and cysto.  She is tolerating PO, ambulating and voiding without difficulty   Consults: None  Significant Diagnostic Studies: labs: routine  Treatments: IV hydration and surgery: see above  Discharge Exam: Blood pressure 124/76, pulse 63, temperature 98.8 F (37.1 C), resp. rate 18, height 5\' 3"  (1.6 m), weight 89.8 kg, SpO2 100 %. General appearance: alert and cooperative Resp: clear to auscultation bilaterally Cardio: regular rate and rhythm Skin: Skin color, texture, turgor normal. No rashes or lesions  Ext neg for Homans sign  Disposition: Discharge disposition: 01-Home or Self Care        Follow-up Information     Crawford Givens, MD On 11/01/2019.   Specialty: Obstetrics and Gynecology Why: appointment time is 10:15 a.m. Contact information: [address] [place] [address] [phone]            Signed: Franklyn Lor [name] 09/23/2019, 9:20 AM

## 2019-09-23 NOTE — Progress Notes (Signed)
Vaginal packing removed with scant serosanguinous drainage.  Foley cath removed and pt instructed [place] to call her when she needs to use BR, call light with pt.  Pt tolerated procedures well.

## 2019-09-23 NOTE — Anesthesia Postprocedure Evaluation (Signed)
Anesthesia Post Note  Patient: Jean Ferguson  Procedure(s) Performed: LAPAROSCOPIC ASSISTED VAGINAL HYSTERECTOMY WITH SALPINGECTOMY (Bilateral Abdomen)     Patient location during evaluation: PACU Anesthesia Type: General Level of consciousness: awake and alert Pain management: pain level controlled Vital Signs Assessment: post-procedure vital signs reviewed and stable Respiratory status: spontaneous breathing, nonlabored ventilation and respiratory function stable Cardiovascular status: blood pressure returned to baseline and stable Postop Assessment: no apparent nausea or vomiting Anesthetic complications: no    Last Vitals:  Vitals:   09/23/19 0543 09/23/19 0925  BP: 124/76 116/66  Pulse: 63 73  Resp: 18 16  Temp: 37.1 C 37.1 C  SpO2: 100% 99%    Last Pain:  Vitals:   09/23/19 0543  TempSrc:   PainSc: [place]

## 2019-09-27 LAB — SURGICAL PATHOLOGY

## 2020-01-03 ENCOUNTER — Other Ambulatory Visit: Payer: BC Managed Care – PPO

## 2020-01-03 ENCOUNTER — Other Ambulatory Visit: Payer: Self-pay

## 2020-01-03 DIAGNOSIS — Z20822 Contact with and (suspected) exposure to covid-19: Secondary | ICD-10-CM

## 2020-01-04 LAB — SARS-COV-2, NAA 2 DAY TAT

## 2020-01-04 LAB — NOVEL CORONAVIRUS, NAA: SARS-CoV-2, NAA: NOT DETECTED

## 2020-05-16 ENCOUNTER — Other Ambulatory Visit: Payer: Self-pay

## 2020-05-16 ENCOUNTER — Encounter (HOSPITAL_COMMUNITY): Payer: Self-pay

## 2020-05-16 ENCOUNTER — Ambulatory Visit (HOSPITAL_COMMUNITY)
Admission: EM | Admit: 2020-05-16 | Discharge: 2020-05-16 | Disposition: A | Discharge status: Home / Self Care | Payer: BC Managed Care – PPO | Attending: Family Medicine | Admitting: Family Medicine

## 2020-05-16 DIAGNOSIS — R109 Unspecified abdominal pain: Secondary | ICD-10-CM | POA: Diagnosis present

## 2020-05-16 DIAGNOSIS — R110 Nausea: Secondary | ICD-10-CM | POA: Insufficient documentation

## 2020-05-16 DIAGNOSIS — Z20822 Contact with and (suspected) exposure to covid-19: Secondary | ICD-10-CM | POA: Insufficient documentation

## 2020-05-16 DIAGNOSIS — R1110 Vomiting, unspecified: Secondary | ICD-10-CM | POA: Diagnosis not present

## 2020-05-16 DIAGNOSIS — Z3202 Encounter for pregnancy test, result negative: Secondary | ICD-10-CM

## 2020-05-16 DIAGNOSIS — R519 Headache, unspecified: Secondary | ICD-10-CM | POA: Diagnosis not present

## 2020-05-16 LAB — POCT URINALYSIS DIPSTICK, ED / UC
Bilirubin Urine: NEGATIVE
Glucose, UA: NEGATIVE mg/dL
Nitrite: NEGATIVE
Protein, ur: NEGATIVE mg/dL
Specific Gravity, Urine: >=1.03 (ref 1.005–1.030)
Urobilinogen, UA: 4 mg/dL — ABNORMAL HIGH (ref 0.0–1.0)
pH: 6 (ref 5.0–8.0)

## 2020-05-16 LAB — POC URINE PREG, ED: Preg Test, Ur: NEGATIVE

## 2020-05-16 MED ORDER — ONDANSETRON 4 MG PO TBDP
4.0000 mg | ORAL_TABLET | Freq: Three times a day (TID) | ORAL | 0 refills | Status: AC | PRN
Start: 2020-05-16 — End: ?

## 2020-05-16 NOTE — Discharge Instructions (Signed)
You have been tested for COVID-19 today. °If your test returns positive, you will receive a phone call from [facility] regarding your results. °Negative test results are not called. °Both positive and negative results area always visible on MyChart. °If you do not have a MyChart account, sign up instructions are provided in your discharge papers. °Please do not hesitate to contact us should you have questions or concerns. ° °

## 2020-05-16 NOTE — ED Triage Notes (Signed)
Pt presents with stomach pain, and aches X 3 days.  Pt states she was vomiting this morning and vomited on Monday. Pt denies nausea, fever, diarrhea.  Pt states she has also been experiencing headaches.

## 2020-05-17 LAB — URINE CULTURE

## 2020-05-17 LAB — SARS CORONAVIRUS 2 (TAT 6-24 HRS): SARS Coronavirus 2: NEGATIVE

## 2020-05-19 NOTE — ED Provider Notes (Signed)
[place]   [id number] 05/16/20 Arrival Time: 1903  ASSESSMENT & PLAN:  1. Abdominal discomfort   2. Nausea     Unclear etiology. Will send urine for culture. UPT negative. COVID testing sent.  Labs Reviewed  POCT URINALYSIS DIPSTICK, ED / UC - Abnormal; Notable for the following components:   Ketones, ur TRACE (*)    Hgb urine dipstick MODERATE (*)    Urobilinogen, UA 4.0 (*)    Leukocytes,Ua SMALL (*)    All other components within normal limits  SARS CORONAVIRUS 2 (TAT 6-24 HRS)  POC URINE PREG, ED    Benign abdominal exam. No indications for urgent abdominal/pelvic imaging at this time. Discussed.  Meds ordered this encounter  Medications  . ondansetron (ZOFRAN-ODT) 4 MG disintegrating tablet    Sig: Take 1 tablet (4 mg total) by mouth every 8 (eight) hours as needed for nausea or vomiting.    Dispense:  15 tablet    Refill:  0     Discharge Instructions     You have been tested for COVID-19 today. If your test returns positive, you will receive a phone call from [facility] regarding your results. Negative test results are not called. Both positive and negative results area always visible on MyChart. If you do not have a MyChart account, sign up instructions are provided in your discharge papers. Please do not hesitate to contact us should you have questions or concerns.        Reviewed expectations re: course of current medical issues. Questions answered. Outlined signs and symptoms indicating need for more acute intervention. Patient verbalized understanding. After Visit Summary given.   SUBJECTIVE: History from: patient. Jean Ferguson is a 44 y.o. female who presents with "stomach ache" and discomfort over the past 2-3 days. Emesis at onset; getting better. No diarrhea reported. Afebrile. Mild headache. No sick contacts. Decreased appetite. No urinary symptoms. No LMP recorded. Patient has had an ablation.   Past Surgical History:   Procedure Laterality Date  . CESAREAN SECTION  x3   last one 1997   Bilateral Tubal Ligation w/ last one  . DILITATION & CURRETTAGE/HYSTROSCOPY WITH HYDROTHERMAL ABLATION N/A 03/30/2017   Procedure: DILATATION & CURETTAGE/HYSTEROSCOPY WITH HYDROTHERMAL ABLATION;  Surgeon: Donnamae Jude, MD;  Location: [place];  Service: Gynecology;  Laterality: N/A;  . LAPAROSCOPIC VAGINAL HYSTERECTOMY WITH SALPINGECTOMY Bilateral 09/22/2019   Procedure: LAPAROSCOPIC ASSISTED VAGINAL HYSTERECTOMY WITH SALPINGECTOMY;  Surgeon: Crawford Givens, MD;  Location: [place];  Service: Gynecology;  Laterality: Bilateral;     OBJECTIVE:  Vitals:   05/16/20 1948  BP: 134/83  Pulse: 85  Resp: 16  Temp: 98.8 F (37.1 C)  TempSrc: Oral  SpO2: 100%    General appearance: alert, oriented, no acute distress HEENT: ; AT; oropharynx dry Lungs: unlabored respirations Abdomen: soft; without distention; mild  and poorly localized tenderness to palpation over epigastrium; normal bowel sounds; without masses or organomegaly; without guarding or rebound tenderness Back: without reported CVA tenderness; FROM at waist Extremities: without LE edema; symmetrical; without gross deformities Skin: warm and dry Neurologic: normal gait Psychological: alert and cooperative; normal mood and affect   Allergies  Allergen Reactions  . Augmentin [Amoxicillin-Pot Clavulanate]   . Morphine And Related Other (See Comments)    Pt states she feels like she is on fire inside  Past Medical History:  Diagnosis Date  . Anemia due to blood loss, chronic   . Hypertension   . Menorrhagia   . Uterine fibroid   . Wears glasses     Social History   Socioeconomic History  . Marital status: Single    Spouse name: Not on file  . Number of children: 3  . Years of education: 10th  . Highest education level: Not on file  Occupational History  .  Occupation: Floor Tech  Tobacco Use  . Smoking status: Former Smoker    Packs/day: 0.25    Years: 15.00    Pack years: 3.75    Types: Cigarettes  . Smokeless tobacco: Never Used  . Tobacco comment: quit August 08, 2018  Vaping Use  . Vaping Use: Never used  Substance and Sexual Activity  . Alcohol use: Yes    Comment: occasional  . Drug use: No  . Sexual activity: Yes    Birth control/protection: Surgical  Other Topics Concern  . Not on file  Social History Narrative   Right-handed.   Lives at home with children.   Occasional caffeine use.   Social Determinants of Health   Financial Resource Strain: Not on file  Food Insecurity: Not on file  Transportation Needs: Not on file  Physical Activity: Not on file  Stress: Not on file  Social Connections: Not on file  Intimate Partner Violence: Not on file    Family History  Problem Relation Age of Onset  . Diabetes Mother   . Lung cancer Father   . Diabetes Father   . Heart attack Paternal Grandfather   . Stroke Paternal Uncle   . Diabetes Paternal Uncle   . Heart attack Paternal Uncle        x 3 brothers  . Stroke Paternal Aunt   . Diabetes Paternal Aunt   . Heart attack Paternal Herbert Moors, MD 05/19/20 [id number]

## 2020-06-09 ENCOUNTER — Encounter (HOSPITAL_COMMUNITY): Payer: Self-pay | Admitting: Emergency Medicine

## 2020-06-09 ENCOUNTER — Other Ambulatory Visit: Payer: Self-pay

## 2020-06-09 ENCOUNTER — Ambulatory Visit (HOSPITAL_COMMUNITY)
Admission: EM | Admit: 2020-06-09 | Discharge: 2020-06-09 | Disposition: A | Discharge status: Home / Self Care | Payer: BC Managed Care – PPO | Attending: Urgent Care | Admitting: Urgent Care

## 2020-06-09 DIAGNOSIS — J069 Acute upper respiratory infection, unspecified: Secondary | ICD-10-CM | POA: Diagnosis not present

## 2020-06-09 DIAGNOSIS — U071 COVID-19: Secondary | ICD-10-CM | POA: Insufficient documentation

## 2020-06-09 DIAGNOSIS — R0981 Nasal congestion: Secondary | ICD-10-CM | POA: Diagnosis present

## 2020-06-09 DIAGNOSIS — J029 Acute pharyngitis, unspecified: Secondary | ICD-10-CM

## 2020-06-09 LAB — SARS CORONAVIRUS 2 (TAT 6-24 HRS): SARS Coronavirus 2: POSITIVE — AB

## 2020-06-09 MED ORDER — PSEUDOEPHEDRINE HCL 60 MG PO TABS
60.0000 mg | ORAL_TABLET | Freq: Three times a day (TID) | ORAL | 0 refills | Status: DC | PRN
Start: 2020-06-09 — End: 2021-05-29

## 2020-06-09 MED ORDER — PROMETHAZINE-DM 6.25-15 MG/5ML PO SYRP
5.0000 mL | ORAL_SOLUTION | Freq: Every evening | ORAL | 0 refills | Status: DC | PRN
Start: 2020-06-09 — End: 2021-05-14

## 2020-06-09 MED ORDER — CETIRIZINE HCL 10 MG PO TABS: 10.0000 mg | ORAL_TABLET | Freq: Every day | ORAL | 0 refills | Status: AC

## 2020-06-09 MED ORDER — BENZONATATE 100 MG PO CAPS: 100.0000 mg | ORAL_CAPSULE | Freq: Three times a day (TID) | ORAL | 0 refills | Status: DC | PRN

## 2020-06-09 NOTE — ED Triage Notes (Signed)
Pt states that she has nasal congestion and sore throat. Pt states that sx started Wednesday.

## 2020-06-09 NOTE — ED Provider Notes (Signed)
[place]   MRN: [id number] DOB: 1976-04-29  Subjective:   Jean Ferguson is a 45 y.o. female presenting for 4-day history of acute onset sinus headache, sinus pressure, sinus congestion, coughing. Patient denies fever, chest pain, shortness of breath. She is COVID vaccinated. Denies history of lung disorders. Patient is not a current smoker.  No current facility-administered medications for this encounter.  Current Outpatient Medications:  .  amLODipine (NORVASC) 5 MG tablet, Take 5 mg by mouth at bedtime., Disp: , Rfl:  .  docusate sodium (COLACE) 100 MG capsule, Take 1 capsule (100 mg total) by mouth 2 (two) times daily., Disp: 10 capsule, Rfl: 0 .  hydrochlorothiazide (HYDRODIURIL) 12.5 MG tablet, Take 12.5 mg by mouth every morning., Disp: , Rfl:  .  ibuprofen (ADVIL) 600 MG tablet, Take 1 tablet (600 mg total) by mouth every 6 (six) hours as needed., Disp: 30 tablet, Rfl: 0 .  ondansetron (ZOFRAN-ODT) 4 MG disintegrating tablet, Take 1 tablet (4 mg total) by mouth every 8 (eight) hours as needed for nausea or vomiting., Disp: 15 tablet, Rfl: 0 .  oxyCODONE-acetaminophen (PERCOCET/ROXICET) 5-325 MG tablet, Take 1-2 tablets by mouth every 4 (four) hours as needed for moderate pain., Disp: 30 tablet, Rfl: 0   Allergies  Allergen Reactions  . Augmentin [Amoxicillin-Pot Clavulanate]   . Morphine And Related Other (See Comments)    Pt states she feels like she is on fire inside    Past Medical History:  Diagnosis Date  . Anemia due to blood loss, chronic   . Hypertension   . Menorrhagia   . Uterine fibroid   . Wears glasses      Past Surgical History:  Procedure Laterality Date  . CESAREAN SECTION  x3   last one 1997   Bilateral Tubal Ligation w/ last one  . DILITATION & CURRETTAGE/HYSTROSCOPY WITH HYDROTHERMAL ABLATION N/A 03/30/2017   Procedure: DILATATION & CURETTAGE/HYSTEROSCOPY WITH HYDROTHERMAL ABLATION;  Surgeon: Donnamae Jude, MD;  Location:  [place];  Service: Gynecology;  Laterality: N/A;  . LAPAROSCOPIC VAGINAL HYSTERECTOMY WITH SALPINGECTOMY Bilateral 09/22/2019   Procedure: LAPAROSCOPIC ASSISTED VAGINAL HYSTERECTOMY WITH SALPINGECTOMY;  Surgeon: Crawford Givens, MD;  Location: [place];  Service: Gynecology;  Laterality: Bilateral;    Family History  Problem Relation Age of Onset  . Diabetes Mother   . Lung cancer Father   . Diabetes Father   . Heart attack Paternal Grandfather   . Stroke Paternal Uncle   . Diabetes Paternal Uncle   . Heart attack Paternal Uncle        x 3 brothers  . Stroke Paternal Aunt   . Diabetes Paternal Aunt   . Heart attack Paternal Aunt     Social History   Tobacco Use  . Smoking status: Former Smoker    Packs/day: 0.25    Years: 15.00    Pack years: 3.75    Types: Cigarettes  . Smokeless tobacco: Never Used  . Tobacco comment: quit August 08, 2018  Vaping Use  . Vaping Use: Never used  Substance Use Topics  . Alcohol use: Yes    Comment: occasional  . Drug use: No    ROS   Objective:   Vitals: BP (!) 147/90 (BP Location: Right Arm)   Pulse 83   Temp 99 F (37.2 C) (Oral)   Resp 17   LMP  (LMP Unknown) Comment: per pt has bleed since last year 2017 so unsure  SpO2 100%   Physical Exam Constitutional:      General: She is not in acute distress.    Appearance: Normal appearance. She is well-developed. She is not ill-appearing, toxic-appearing or diaphoretic.  HENT:     Head: Normocephalic and atraumatic.     Nose: Nose normal.     Mouth/Throat:     Mouth: Mucous membranes are moist.  Eyes:     Extraocular Movements: Extraocular movements intact.     Pupils: Pupils are equal, round, and reactive to light.  Cardiovascular:     Rate and Rhythm: Normal rate and regular rhythm.     Pulses: Normal pulses.     Heart sounds: Normal heart sounds. No murmur heard. No friction rub. No gallop.   Pulmonary:     Effort: Pulmonary effort  is normal. No respiratory distress.     Breath sounds: Normal breath sounds. No stridor. No wheezing, rhonchi or rales.  Skin:    General: Skin is warm and dry.     Findings: No rash.  Neurological:     Mental Status: She is alert and oriented to person, place, and time.  Psychiatric:        Mood and Affect: Mood normal.        Behavior: Behavior normal.        Thought Content: Thought content normal.     Assessment and Plan :   PDMP not reviewed this encounter.  1. Viral URI with cough   2. Sore throat   3. Nasal congestion     Offered patient a chest x-ray but she declined x3. Will manage for viral illness such as viral URI, viral syndrome, viral rhinitis, COVID-19. Counseled patient on nature of COVID-19 including modes of transmission, diagnostic testing, management and supportive care.  Offered scripts for symptomatic relief. COVID 19 testing is pending. Counseled patient on potential for adverse effects with medications prescribed/recommended today, ER and return-to-clinic precautions discussed, patient verbalized understanding.     Jaynee Eagles, PA-C 06/09/20 1223

## 2020-06-09 NOTE — Discharge Instructions (Addendum)

## 2020-12-25 ENCOUNTER — Other Ambulatory Visit: Payer: Self-pay

## 2020-12-25 ENCOUNTER — Ambulatory Visit (HOSPITAL_COMMUNITY)
Admission: EM | Admit: 2020-12-25 | Discharge: 2020-12-25 | Disposition: A | Discharge status: Home / Self Care | Payer: BC Managed Care – PPO | Attending: Physician Assistant | Admitting: Physician Assistant

## 2020-12-25 ENCOUNTER — Encounter (HOSPITAL_COMMUNITY): Payer: Self-pay | Admitting: *Deleted

## 2020-12-25 DIAGNOSIS — J029 Acute pharyngitis, unspecified: Secondary | ICD-10-CM | POA: Diagnosis present

## 2020-12-25 DIAGNOSIS — R0982 Postnasal drip: Secondary | ICD-10-CM | POA: Diagnosis present

## 2020-12-25 LAB — POCT RAPID STREP A, ED / UC: Streptococcus, Group A Screen (Direct): NEGATIVE

## 2020-12-25 MED ORDER — AZITHROMYCIN 250 MG PO TABS
250.0000 mg | ORAL_TABLET | Freq: Every day | ORAL | 0 refills | Status: DC
Start: 2020-12-25 — End: 2021-04-08

## 2020-12-25 MED ORDER — FLUTICASONE PROPIONATE 50 MCG/ACT NA SUSP: 1.0000 | Freq: Every day | NASAL | 0 refills | Status: AC

## 2020-12-25 NOTE — Discharge Instructions (Signed)
Your strep was negative.  We are going to cover you for infection given your symptoms of been present for more than a week.  Due to your allergies we are going to do azithromycin.  Please take this as prescribed.  I also recommend he use over-the-counter medications to manage her symptoms including Flonase as well as antihistamines (Zyrtec).  Gargle with warm salt water.  You can use Tylenol and ibuprofen for pain relief.  If anything worsens please return for reevaluation.

## 2020-12-25 NOTE — ED Triage Notes (Signed)
Pt reports a sore throat for over one week. Pt reports 3 neg home test for COVID.

## 2020-12-25 NOTE — ED Provider Notes (Signed)
[place]    CSN: [id number] Arrival date & time: 12/25/20  1713      History   Chief Complaint Chief Complaint  Patient presents with   Sore Throat    HPI MAKARI SIEK is a 45 y.o. female.   Patient presents today with a week plus history of sore throat.  She reports associated nasal congestion and cough.  Denies fever, chest pain, shortness of breath, nausea, vomiting.  Denies any known sick contacts.  She has taken several at home COVID test that were negative.  She denies history of seasonal allergies.  She has tried Tylenol and over-the-counter medications without improvement of symptoms.  She denies history of smoking, COPD, asthma.  She is up-to-date on COVID-19 vaccinations but has not had flu shot.  She describes discomfort as rated 3 on a 0-10 pain scale, localized to posterior oropharynx, described as scratchy/aching, worse with swallowing, no alleviating factors identified.  She has been eating and drinking normally despite symptoms.   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   Visual disturbance 02/25/2016   Headache 02/25/2016   Chronic blood loss anemia 02/06/2016   Menorrhagia 02/06/2016   Fibroid uterus 01/27/2016    Past Surgical History:  Procedure Laterality Date   CESAREAN SECTION  x3   last one 1997   Bilateral Tubal Ligation w/ last one   DILITATION & CURRETTAGE/HYSTROSCOPY WITH HYDROTHERMAL ABLATION N/A 03/30/2017   Procedure: DILATATION & CURETTAGE/HYSTEROSCOPY WITH HYDROTHERMAL ABLATION;  Surgeon: Donnamae Jude, MD;  Location: [place];  Service: Gynecology;  Laterality: N/A;   LAPAROSCOPIC VAGINAL HYSTERECTOMY WITH SALPINGECTOMY Bilateral 09/22/2019   Procedure: LAPAROSCOPIC ASSISTED  VAGINAL HYSTERECTOMY WITH SALPINGECTOMY;  Surgeon: Crawford Givens, MD;  Location: [place];  Service: Gynecology;  Laterality: Bilateral;    OB History     Gravida  3   Para  3   Term      Preterm  3   AB      Living  3      SAB      IAB      Ectopic      Multiple      Live Births  3            Home Medications    Prior to Admission medications   Medication Sig Start Date End Date Taking? Authorizing Provider  azithromycin (ZITHROMAX) 250 MG tablet Take 1 tablet (250 mg total) by mouth daily. Take first 2 tablets together, then 1 every day until finished. 12/25/20  Yes Tanairy Payeur K, PA-C  fluticasone (FLONASE) 50 MCG/ACT nasal spray Place 1 spray into both nostrils daily. 12/25/20  Yes [place] Rollyson K, PA-C  amLODipine (NORVASC) 5 MG tablet Take 5 mg by mouth at bedtime. 03/21/19   [provider]  benzonatate (TESSALON) 100 MG capsule Take 1-2 capsules (100-200 mg total) by mouth 3 (three) times daily as needed for cough. 06/09/20   Jaynee Eagles, PA-C  cetirizine (ZYRTEC ALLERGY) 10 MG tablet Take 1 tablet (10 mg total) by mouth daily. 06/09/20   Jaynee Eagles, PA-C  docusate sodium (COLACE) 100 MG capsule Take 1 capsule (100 mg total) by mouth  2 (two) times daily. 09/23/19   Crawford Givens, MD  hydrochlorothiazide (HYDRODIURIL) 12.5 MG tablet Take 12.5 mg by mouth every morning. 03/21/19   [provider]  ibuprofen (ADVIL) 600 MG tablet Take 1 tablet (600 mg total) by mouth every 6 (six) hours as needed. 09/23/19   Crawford Givens, MD  ondansetron (ZOFRAN-ODT) 4 MG disintegrating tablet Take 1 tablet (4 mg total) by mouth every 8 (eight) hours as needed for nausea or vomiting. 05/16/20   Vanessa Kick, MD  oxyCODONE-acetaminophen (PERCOCET/ROXICET) 5-325 MG tablet Take 1-2 tablets by mouth every 4 (four) hours as needed for moderate pain. 09/23/19   Crawford Givens, MD  promethazine-dextromethorphan (PROMETHAZINE-DM) 6.25-15 MG/5ML syrup Take  5 mLs by mouth at bedtime as needed for cough. 06/09/20   Jaynee Eagles, PA-C  pseudoephedrine (SUDAFED) 60 MG tablet Take 1 tablet (60 mg total) by mouth every 8 (eight) hours as needed for congestion. 06/09/20   Jaynee Eagles, PA-C  famotidine (PEPCID) 20 MG tablet Take 1 tablet (20 mg total) by mouth 2 (two) times daily. Patient not taking: Reported on 03/17/2018 01/26/18 12/30/18  Carlisle Cater, PA-C    Family History Family History  Problem Relation Age of Onset   Diabetes Mother    Lung cancer Father    Diabetes Father    Heart attack Paternal Grandfather    Stroke Paternal Uncle    Diabetes Paternal Uncle    Heart attack Paternal Uncle        x 3 brothers   Stroke Paternal Aunt    Diabetes Paternal Aunt    Heart attack Paternal Aunt     Social History Social History   Tobacco Use   Smoking status: Former    Packs/day: 0.25    Years: 15.00    Pack years: 3.75    Types: Cigarettes   Smokeless tobacco: Never   Tobacco comments:    quit August 08, 2018  Vaping Use   Vaping Use: Never used  Substance Use Topics   Alcohol use: Yes    Comment: occasional   Drug use: No     Allergies   Augmentin [amoxicillin-pot clavulanate] and Morphine and related   Review of Systems Review of Systems  Constitutional:  Negative for activity change, appetite change, fatigue and fever.  HENT:  Positive for congestion, sinus pressure and sore throat. Negative for sneezing.   Respiratory:  Positive for cough. Negative for shortness of breath.   Cardiovascular:  Negative for chest pain.  Gastrointestinal:  Negative for abdominal pain, diarrhea, nausea and vomiting.  Musculoskeletal:  Negative for arthralgias and myalgias.  Neurological:  Positive for headaches. Negative for dizziness and light-headedness.    Physical Exam Triage Vital Signs ED Triage Vitals [12/25/20 1810]  Enc Vitals Group     BP 124/70     Pulse Rate 74     Resp 18     Temp 99.3 F (37.4 C)     Temp src       SpO2 100 %     Weight      Height      Head Circumference      Peak Flow      Pain Score 3     Pain Loc      Pain Edu?      Excl. in [place]?    No data found.  Updated Vital Signs BP 124/70   Pulse 74   Temp 99.3 F (37.4 C)   Resp 18   LMP  (  LMP Unknown) Comment: per pt has bleed since last year 2017 so unsure   SpO2 100%   Visual Acuity Right Eye Distance:   Left Eye Distance:   Bilateral Distance:    Right Eye Near:   Left Eye Near:    Bilateral Near:     Physical Exam Vitals reviewed.  Constitutional:      General: She is awake. She is not in acute distress.    Appearance: Normal appearance. She is normal weight. She is not ill-appearing.     Comments: Very pleasant female appears stated age in no acute distress sitting comfortably in exam room  HENT:     Head: Normocephalic and atraumatic.     Right Ear: Tympanic membrane, ear canal and external ear normal. There is impacted cerumen. Tympanic membrane is not erythematous or bulging.     Left Ear: Tympanic membrane, ear canal and external ear normal. There is impacted cerumen. Tympanic membrane is not erythematous or bulging.     Ears:     Comments: Cerumen impaction noted bilaterally.  Able to visualize approximately 20-30% of TM that appears normal    Nose:     Right Sinus: No maxillary sinus tenderness or frontal sinus tenderness.     Left Sinus: No maxillary sinus tenderness or frontal sinus tenderness.     Mouth/Throat:     Pharynx: Uvula midline. Posterior oropharyngeal erythema present. No oropharyngeal exudate.     Comments: Significant erythema and drainage of posterior oropharynx Cardiovascular:     Rate and Rhythm: Normal rate and regular rhythm.     Heart sounds: Normal heart sounds, S1 normal and S2 normal. No murmur heard. Pulmonary:     Effort: Pulmonary effort is normal.     Breath sounds: Normal breath sounds. No wheezing, rhonchi or rales.     Comments: Clear to auscultation  bilaterally Lymphadenopathy:     Head:     Right side of head: No submental, submandibular or tonsillar adenopathy.     Left side of head: No submental, submandibular or tonsillar adenopathy.     Cervical: No cervical adenopathy.  Psychiatric:        Behavior: Behavior is cooperative.     UC Treatments / Results  Labs (all labs ordered are listed, but only abnormal results are displayed) Labs Reviewed  CULTURE, GROUP A STREP [facility])  POCT RAPID STREP A, ED / UC    EKG   Radiology No results found.  Procedures Procedures (including critical care time)  Medications Ordered in UC Medications - No data to display  Initial Impression / Assessment and Plan / UC Course  I have reviewed the triage vital signs and the nursing notes.  Pertinent labs & imaging results that were available during my care of the patient were reviewed by me and considered in my medical decision making (see chart for details).     Strep was negative in clinic today.  Throat culture obtained-results pending.  Given prolonged and worsening symptoms patient was started on azithromycin due to allergy to amoxicillin.  Discussed that allergy symptoms could be contributing to symptoms and so she was encouraged to use allergy medication including Flonase and Zyrtec for symptom relief.  Recommend she gargle with warm salt water and use over-the-counter medications such as Tylenol or ibuprofen for pain relief.  Discussed alarm symptoms that warrant emergent evaluation.  Strict return precautions given to which patient expressed understanding.  Final Clinical Impressions(s) / UC Diagnoses   Final diagnoses:  Acute  pharyngitis, unspecified etiology  Sore throat  Post-nasal drainage     Discharge Instructions      Your strep was negative.  We are going to cover you for infection given your symptoms of been present for more than a week.  Due to your allergies we are going to do azithromycin.  Please take this as  prescribed.  I also recommend he use over-the-counter medications to manage her symptoms including Flonase as well as antihistamines (Zyrtec).  Gargle with warm salt water.  You can use Tylenol and ibuprofen for pain relief.  If anything worsens please return for reevaluation.     ED Prescriptions     Medication Sig Dispense Auth. Provider   fluticasone (FLONASE) 50 MCG/ACT nasal spray Place 1 spray into both nostrils daily. 16 g Linn Clavin K, PA-C   azithromycin (ZITHROMAX) 250 MG tablet Take 1 tablet (250 mg total) by mouth daily. Take first 2 tablets together, then 1 every day until finished. 6 tablet Dawsyn Ramsaran, Derry Skill, PA-C      PDMP not reviewed this encounter.   Terrilee Croak, PA-C 12/25/20 1900

## 2020-12-28 LAB — CULTURE, GROUP A STREP (THRC)

## 2021-04-08 ENCOUNTER — Encounter (HOSPITAL_COMMUNITY): Payer: Self-pay

## 2021-04-08 ENCOUNTER — Ambulatory Visit (HOSPITAL_COMMUNITY)
Admission: EM | Admit: 2021-04-08 | Discharge: 2021-04-08 | Disposition: A | Discharge status: Home / Self Care | Payer: BC Managed Care – PPO | Attending: Internal Medicine | Admitting: Internal Medicine

## 2021-04-08 ENCOUNTER — Other Ambulatory Visit: Payer: Self-pay

## 2021-04-08 DIAGNOSIS — B3731 Acute candidiasis of vulva and vagina: Secondary | ICD-10-CM | POA: Diagnosis not present

## 2021-04-08 MED ORDER — FLUCONAZOLE 150 MG PO TABS
150.0000 mg | ORAL_TABLET | Freq: Once | ORAL | 0 refills | Status: AC
Start: 2021-04-08 — End: 2021-04-08

## 2021-04-08 NOTE — ED Triage Notes (Signed)
Pt presents with vaginal irritation and itching for over a week.

## 2021-04-08 NOTE — Discharge Instructions (Signed)
Please take your medications as prescribed. If no improvement in symptoms please return to urgent care to be re-evaluated.

## 2021-04-09 NOTE — ED Provider Notes (Signed)
[place]    CSN: [id number] Arrival date & time: 04/08/21  1537      History   Chief Complaint Chief Complaint  Patient presents with   Vaginitis    HPI Jean Ferguson is a 45 y.o. female comes to the urgent care with vaginal itching and irritation which started a few days after she was started on antibiotics.  Patient has had a similar episode in the past with antibiotics.  No dysuria urgency or frequency.  No abdominal pain.  No nausea or vomiting.  No diarrhea.  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   Visual disturbance 02/25/2016   Headache 02/25/2016   Chronic blood loss anemia 02/06/2016   Menorrhagia 02/06/2016   Fibroid uterus 01/27/2016    Past Surgical History:  Procedure Laterality Date   CESAREAN SECTION  x3   last one 1997   Bilateral Tubal Ligation w/ last one   DILITATION & CURRETTAGE/HYSTROSCOPY WITH HYDROTHERMAL ABLATION N/A 03/30/2017   Procedure: DILATATION & CURETTAGE/HYSTEROSCOPY WITH HYDROTHERMAL ABLATION;  Surgeon: Donnamae Jude, MD;  Location: [facility];  Service: Gynecology;  Laterality: N/A;   LAPAROSCOPIC VAGINAL HYSTERECTOMY WITH SALPINGECTOMY Bilateral 09/22/2019   Procedure: LAPAROSCOPIC ASSISTED VAGINAL HYSTERECTOMY WITH SALPINGECTOMY;  Surgeon: Crawford Givens, MD;  Location: [facility];  Service: Gynecology;  Laterality: Bilateral;    OB History     Gravida  3   Para  3   Term      Preterm  3   AB      Living  3      SAB      IAB      Ectopic      Multiple      Live Births  3            Home Medications    Prior to Admission medications   Medication Sig Start Date End Date Taking? Authorizing Provider   amLODipine (NORVASC) 5 MG tablet Take 5 mg by mouth at bedtime. 03/21/19   [provider]  benzonatate (TESSALON) 100 MG capsule Take 1-2 capsules (100-200 mg total) by mouth 3 (three) times daily as needed for cough. 06/09/20   Jaynee Eagles, PA-C  cetirizine (ZYRTEC ALLERGY) 10 MG tablet Take 1 tablet (10 mg total) by mouth daily. 06/09/20   Jaynee Eagles, PA-C  docusate sodium (COLACE) 100 MG capsule Take 1 capsule (100 mg total) by mouth 2 (two) times daily. 09/23/19   Dillard, Gwynneth Munson, MD  fluticasone (FLONASE) 50 MCG/ACT nasal spray Place 1 spray into both nostrils daily. 12/25/20   Raspet, Derry Skill, PA-C  hydrochlorothiazide (HYDRODIURIL) 12.5 MG tablet Take 12.5 mg by mouth every morning. 03/21/19   [provider]  ibuprofen (ADVIL) 600 MG tablet Take 1 tablet (600 mg total) by mouth every 6 (six) hours as needed. 09/23/19   Crawford Givens, MD  ondansetron (ZOFRAN-ODT) 4 MG disintegrating tablet Take 1 tablet (4 mg total) by mouth every 8 (eight) hours as needed for nausea or vomiting. 05/16/20   Vanessa Kick, MD  oxyCODONE-acetaminophen (PERCOCET/ROXICET) 5-325 MG tablet Take 1-2 tablets by mouth every 4 (four) hours  as needed for moderate pain. 09/23/19   Crawford Givens, MD  promethazine-dextromethorphan (PROMETHAZINE-DM) 6.25-15 MG/5ML syrup Take 5 mLs by mouth at bedtime as needed for cough. 06/09/20   Jaynee Eagles, PA-C  pseudoephedrine (SUDAFED) 60 MG tablet Take 1 tablet (60 mg total) by mouth every 8 (eight) hours as needed for congestion. 06/09/20   Jaynee Eagles, PA-C  famotidine (PEPCID) 20 MG tablet Take 1 tablet (20 mg total) by mouth 2 (two) times daily. Patient not taking: Reported on 03/17/2018 01/26/18 12/30/18  Carlisle Cater, PA-C    Family History Family History  Problem Relation Age of Onset   Diabetes Mother    Lung cancer Father    Diabetes Father    Heart attack Paternal Grandfather    Stroke Paternal Uncle    Diabetes Paternal Uncle    Heart attack Paternal  Uncle        x 3 brothers   Stroke Paternal Aunt    Diabetes Paternal Aunt    Heart attack Paternal Aunt     Social History Social History   Tobacco Use   Smoking status: Former    Packs/day: 0.25    Years: 15.00    Pack years: 3.75    Types: Cigarettes   Smokeless tobacco: Never   Tobacco comments:    quit August 08, 2018  Vaping Use   Vaping Use: Never used  Substance Use Topics   Alcohol use: Yes    Comment: occasional   Drug use: No     Allergies   Augmentin [amoxicillin-pot clavulanate] and Morphine and related   Review of Systems Review of Systems  Gastrointestinal: Negative.   Genitourinary:  Positive for vaginal discharge. Negative for dysuria, frequency, urgency, vaginal bleeding and vaginal pain.    Physical Exam Triage Vital Signs ED Triage Vitals  Enc Vitals Group     BP 04/08/21 1746 129/85     Pulse Rate 04/08/21 1746 66     Resp 04/08/21 1746 18     Temp 04/08/21 1746 98.6 F (37 C)     Temp Source 04/08/21 1746 Oral     SpO2 04/08/21 1746 100 %     Weight --      Height --      Head Circumference --      Peak Flow --      Pain Score 04/08/21 1747 4     Pain Loc --      Pain Edu? --      Excl. in [place]? --    No data found.  Updated Vital Signs BP 129/85 (BP Location: Right Arm)   Pulse 66   Temp 98.6 F (37 C) (Oral)   Resp 18   LMP  (LMP Unknown) Comment: per pt has bleed since last year 2017 so unsure   SpO2 100%   Visual Acuity Right Eye Distance:   Left Eye Distance:   Bilateral Distance:    Right Eye Near:   Left Eye Near:    Bilateral Near:     Physical Exam Vitals and nursing note reviewed.  Constitutional:      General: She is not in acute distress.    Appearance: She is not ill-appearing.  Cardiovascular:     Pulses: Normal pulses.     Heart sounds: Normal heart sounds.  Pulmonary:     Effort: Pulmonary effort is normal.     Breath sounds: Normal breath sounds.  Abdominal:     General: Bowel sounds are  normal.  Palpations: Abdomen is soft.  Musculoskeletal:        General: Normal range of motion.  Neurological:     Mental Status: She is alert.     UC Treatments / Results  Labs (all labs ordered are listed, but only abnormal results are displayed) Labs Reviewed - No data to display  EKG   Radiology No results found.  Procedures Procedures (including critical care time)  Medications Ordered in UC Medications - No data to display  Initial Impression / Assessment and Plan / UC Course  I have reviewed the triage vital signs and the nursing notes.  Pertinent labs & imaging results that were available during my care of the patient were reviewed by me and considered in my medical decision making (see chart for details).     1.  Acute vaginitis secondary to vaginal yeast infection: Fluconazole and 50 mg x 1 dose to be repeated in 72 hours if no improvement in symptoms If symptoms worsen please return to the urgent care to be reevaluated. Final Clinical Impressions(s) / UC Diagnoses   Final diagnoses:  Vaginal yeast infection     Discharge Instructions      Please take your medications as prescribed. If no improvement in symptoms please return to urgent care to be re-evaluated.   ED Prescriptions     Medication Sig Dispense Auth. Provider   fluconazole (DIFLUCAN) 150 MG tablet Take 1 tablet (150 mg total) by mouth once for 1 dose. Please take second dose in 3 days if no improvement in symptoms. 2 tablet Glenyce Randle, Myrene Galas, MD      PDMP not reviewed this encounter.   Chase Picket, MD 04/09/21 1143

## 2021-05-14 ENCOUNTER — Ambulatory Visit (HOSPITAL_COMMUNITY)
Admission: EM | Admit: 2021-05-14 | Discharge: 2021-05-14 | Disposition: A | Discharge status: Home / Self Care | Payer: BC Managed Care – PPO | Attending: Physician Assistant | Admitting: Physician Assistant

## 2021-05-14 ENCOUNTER — Encounter (HOSPITAL_COMMUNITY): Payer: Self-pay

## 2021-05-14 ENCOUNTER — Other Ambulatory Visit: Payer: Self-pay

## 2021-05-14 DIAGNOSIS — R051 Acute cough: Secondary | ICD-10-CM | POA: Diagnosis present

## 2021-05-14 DIAGNOSIS — Z87891 Personal history of nicotine dependence: Secondary | ICD-10-CM | POA: Diagnosis not present

## 2021-05-14 DIAGNOSIS — J069 Acute upper respiratory infection, unspecified: Secondary | ICD-10-CM | POA: Diagnosis not present

## 2021-05-14 DIAGNOSIS — Z20822 Contact with and (suspected) exposure to covid-19: Secondary | ICD-10-CM | POA: Diagnosis not present

## 2021-05-14 DIAGNOSIS — J029 Acute pharyngitis, unspecified: Secondary | ICD-10-CM

## 2021-05-14 DIAGNOSIS — R5381 Other malaise: Secondary | ICD-10-CM | POA: Insufficient documentation

## 2021-05-14 LAB — POC INFLUENZA A AND B ANTIGEN (URGENT CARE ONLY)
INFLUENZA A ANTIGEN, POC: NEGATIVE
INFLUENZA B ANTIGEN, POC: NEGATIVE

## 2021-05-14 LAB — POCT RAPID STREP A, ED / UC: Streptococcus, Group A Screen (Direct): NEGATIVE

## 2021-05-14 MED ORDER — PROMETHAZINE-DM 6.25-15 MG/5ML PO SYRP
5.0000 mL | ORAL_SOLUTION | Freq: Three times a day (TID) | ORAL | 0 refills | Status: DC | PRN
Start: 2021-05-14 — End: 2021-05-29

## 2021-05-14 NOTE — ED Triage Notes (Signed)
Pt presents with a headache, sore throat and nasal congestion x 3 DAYS.

## 2021-05-14 NOTE — ED Provider Notes (Signed)
[place]    CSN: [id number] Arrival date & time: 05/14/21  4696      History   Chief Complaint Chief Complaint  Patient presents with   Headache   Sore Throat    HPI Jean Ferguson is a 45 y.o. female.   Patient presents today with a 3-day history of URI symptoms.  Reports headache, sore throat, nasal congestion, cough, fatigue, malaise.  Denies any nausea, vomiting, chest pain, shortness of breath.  She has tried Tylenol and ibuprofen without improvement of symptoms.  Denies any known sick contacts.  She has had COVID-19 vaccination but not booster.  She has not had flu shot.  Denies any recent antibiotic use.  She is able to eat and drink despite symptoms but reports this is painful due to sore throat symptoms.  She does have a history of allergies and has taken allergy medication as prescribed.  Denies history of asthma, COPD, smoking.   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   Visual disturbance 02/25/2016   Headache 02/25/2016   Chronic blood loss anemia 02/06/2016   Menorrhagia 02/06/2016   Fibroid uterus 01/27/2016    Past Surgical History:  Procedure Laterality Date   CESAREAN SECTION  x3   last one 1997   Bilateral Tubal Ligation w/ last one   DILITATION & CURRETTAGE/HYSTROSCOPY WITH HYDROTHERMAL ABLATION N/A 03/30/2017   Procedure: DILATATION & CURETTAGE/HYSTEROSCOPY WITH HYDROTHERMAL ABLATION;  Surgeon: Donnamae Jude, MD;  Location: [facility];  Service: Gynecology;  Laterality: N/A;   LAPAROSCOPIC VAGINAL HYSTERECTOMY WITH SALPINGECTOMY Bilateral 09/22/2019   Procedure: LAPAROSCOPIC ASSISTED VAGINAL HYSTERECTOMY WITH SALPINGECTOMY;  Surgeon: Crawford Givens, MD;  Location: [facility];   Service: Gynecology;  Laterality: Bilateral;    OB History     Gravida  3   Para  3   Term      Preterm  3   AB      Living  3      SAB      IAB      Ectopic      Multiple      Live Births  3            Home Medications    Prior to Admission medications   Medication Sig Start Date End Date Taking? Authorizing Provider  promethazine-dextromethorphan (PROMETHAZINE-DM) 6.25-15 MG/5ML syrup Take 5 mLs by mouth 3 (three) times daily as needed for cough. 05/14/21  Yes Lovelle Deitrick K, PA-C  amLODipine (NORVASC) 5 MG tablet Take 5 mg by mouth at bedtime. 03/21/19   [provider]  cetirizine (ZYRTEC ALLERGY) 10 MG tablet Take 1 tablet (10 mg total) by mouth daily. 06/09/20   Jaynee Eagles, PA-C  docusate sodium (COLACE) 100 MG capsule Take 1 capsule (100 mg total) by mouth 2 (two) times daily. 09/23/19   Dillard, Gwynneth Munson, MD  fluticasone (FLONASE) 50 MCG/ACT nasal spray Place 1 spray into both nostrils daily. 12/25/20   Jianni Shelden, Derry Skill, PA-C  hydrochlorothiazide (HYDRODIURIL) 12.5 MG tablet Take 12.5 mg by mouth every morning. 03/21/19   [provider]  ibuprofen (ADVIL) 600 MG tablet Take 1 tablet (600 mg total) by mouth every  6 (six) hours as needed. 09/23/19   Crawford Givens, MD  ondansetron (ZOFRAN-ODT) 4 MG disintegrating tablet Take 1 tablet (4 mg total) by mouth every 8 (eight) hours as needed for nausea or vomiting. 05/16/20   Vanessa Kick, MD  oxyCODONE-acetaminophen (PERCOCET/ROXICET) 5-325 MG tablet Take 1-2 tablets by mouth every 4 (four) hours as needed for moderate pain. 09/23/19   Crawford Givens, MD  pseudoephedrine (SUDAFED) 60 MG tablet Take 1 tablet (60 mg total) by mouth every 8 (eight) hours as needed for congestion. 06/09/20   Jaynee Eagles, PA-C  famotidine (PEPCID) 20 MG tablet Take 1 tablet (20 mg total) by mouth 2 (two) times daily. Patient not taking: Reported on 03/17/2018 01/26/18 12/30/18  Carlisle Cater, PA-C    Family History Family  History  Problem Relation Age of Onset   Diabetes Mother    Lung cancer Father    Diabetes Father    Heart attack Paternal Grandfather    Stroke Paternal Uncle    Diabetes Paternal Uncle    Heart attack Paternal Uncle        x 3 brothers   Stroke Paternal Aunt    Diabetes Paternal Aunt    Heart attack Paternal Aunt     Social History Social History   Tobacco Use   Smoking status: Former    Packs/day: 0.25    Years: 15.00    Pack years: 3.75    Types: Cigarettes   Smokeless tobacco: Never   Tobacco comments:    quit August 08, 2018  Vaping Use   Vaping Use: Never used  Substance Use Topics   Alcohol use: Yes    Comment: occasional   Drug use: No     Allergies   Augmentin [amoxicillin-pot clavulanate] and Morphine and related   Review of Systems Review of Systems  Constitutional:  Positive for activity change, chills, fatigue and fever. Negative for appetite change.  HENT:  Positive for congestion and sore throat. Negative for sinus pressure and sneezing.   Respiratory:  Positive for cough. Negative for shortness of breath.   Cardiovascular:  Negative for chest pain.  Gastrointestinal:  Negative for abdominal pain, diarrhea, nausea and vomiting.  Neurological:  Positive for headaches. Negative for dizziness and light-headedness.    Physical Exam Triage Vital Signs ED Triage Vitals  Enc Vitals Group     BP 05/14/21 0829 121/74     Pulse Rate 05/14/21 0829 96     Resp 05/14/21 0829 18     Temp 05/14/21 0829 99.9 F (37.7 C)     Temp Source 05/14/21 0829 Oral     SpO2 05/14/21 0829 100 %     Weight --      Height --      Head Circumference --      Peak Flow --      Pain Score 05/14/21 0827 4     Pain Loc --      Pain Edu? --      Excl. in [place]? --    No data found.  Updated Vital Signs BP 121/74 (BP Location: Left Arm)   Pulse 96   Temp 99.9 F (37.7 C) (Oral)   Resp 18   LMP  (LMP Unknown) Comment: per pt has bleed since last year 2017 so unsure    SpO2 100%   Visual Acuity Right Eye Distance:   Left Eye Distance:   Bilateral Distance:    Right Eye Near:   Left Eye Near:  Bilateral Near:     Physical Exam Vitals reviewed.  Constitutional:      General: She is awake. She is not in acute distress.    Appearance: Normal appearance. She is well-developed. She is not ill-appearing.     Comments: Very pleasant female appears stated age in no acute distress sitting comfortably in exam room  HENT:     Head: Normocephalic and atraumatic.     Right Ear: Tympanic membrane, ear canal and external ear normal. Tympanic membrane is not erythematous or bulging.     Left Ear: Tympanic membrane, ear canal and external ear normal. Tympanic membrane is not erythematous or bulging.     Nose:     Right Sinus: No maxillary sinus tenderness or frontal sinus tenderness.     Left Sinus: No maxillary sinus tenderness or frontal sinus tenderness.     Mouth/Throat:     Pharynx: Uvula midline. Posterior oropharyngeal erythema present. No oropharyngeal exudate.     Tonsils: No tonsillar exudate.  Cardiovascular:     Rate and Rhythm: Normal rate and regular rhythm.     Heart sounds: Normal heart sounds, S1 normal and S2 normal. No murmur heard. Pulmonary:     Effort: Pulmonary effort is normal.     Breath sounds: Normal breath sounds. No wheezing, rhonchi or rales.     Comments: Clear to auscultation bilaterally Psychiatric:        Behavior: Behavior is cooperative.     UC Treatments / Results  Labs (all labs ordered are listed, but only abnormal results are displayed) Labs Reviewed  SARS CORONAVIRUS 2 (TAT 6-24 HRS)  POC INFLUENZA A AND B ANTIGEN (URGENT CARE ONLY)  POCT RAPID STREP A, ED / UC    EKG   Radiology No results found.  Procedures Procedures (including critical care time)  Medications Ordered in UC Medications - No data to display  Initial Impression / Assessment and Plan / UC Course  I have reviewed the triage  vital signs and the nursing notes.  Pertinent labs & imaging results that were available during my care of the patient were reviewed by me and considered in my medical decision making (see chart for details).     Discussed likely viral etiology.  Influenza testing was negative in clinic.  Strep was also obtained given severe sore throat which was negative.  COVID is pending.  No evidence of acute infection that warrant initiation of antibiotics.  Recommended she use conservative treatment measures including alternating Tylenol ibuprofen as well as using Mucinex and Flonase.  She was given Promethazine DM for cough with instruction not to drive or drink alcohol while taking this medication as drowsiness is a common side effect.  She was given work excuse note with current CDC return to work guidelines based on COVID test result.  Discussed alarm symptoms that warrant emergent evaluation.  Strict return precautions given to which she expressed understanding.  Final Clinical Impressions(s) / UC Diagnoses   Final diagnoses:  Upper respiratory tract infection, unspecified type  Sore throat  Acute cough     Discharge Instructions      Your flu test was negative.  Your strep test was negative.  We will contact you if your COVID test is positive.  I suspect that you have a virus.  Please use Tylenol and ibuprofen for fever and pain make sure you rest and drink plenty of fluid.  Use Promethazine DM up to 3 times a day as needed for cough.  This can make you sleepy so not drive or drink alcohol taking it.  Use Mucinex and Flonase for additional symptom relief.  If anything worsens please return for reevaluation.     ED Prescriptions     Medication Sig Dispense Auth. Provider   promethazine-dextromethorphan (PROMETHAZINE-DM) 6.25-15 MG/5ML syrup Take 5 mLs by mouth 3 (three) times daily as needed for cough. 118 mL Janyra Barillas K, PA-C      PDMP not reviewed this encounter.   Terrilee Croak,  PA-C 05/14/21 1000

## 2021-05-14 NOTE — Discharge Instructions (Signed)
Your flu test was negative.  Your strep test was negative.  We will contact you if your COVID test is positive.  I suspect that you have a virus.  Please use Tylenol and ibuprofen for fever and pain make sure you rest and drink plenty of fluid.  Use Promethazine DM up to 3 times a day as needed for cough.  This can make you sleepy so not drive or drink alcohol taking it.  Use Mucinex and Flonase for additional symptom relief.  If anything worsens please return for reevaluation.

## 2021-05-15 LAB — SARS CORONAVIRUS 2 (TAT 6-24 HRS): SARS Coronavirus 2: NEGATIVE

## 2021-05-29 ENCOUNTER — Other Ambulatory Visit: Payer: Self-pay

## 2021-05-29 ENCOUNTER — Ambulatory Visit (HOSPITAL_COMMUNITY)
Admission: EM | Admit: 2021-05-29 | Discharge: 2021-05-29 | Disposition: A | Discharge status: Home / Self Care | Payer: BC Managed Care – PPO | Attending: Internal Medicine | Admitting: Internal Medicine

## 2021-05-29 DIAGNOSIS — U071 COVID-19: Secondary | ICD-10-CM

## 2021-05-29 MED ORDER — IBUPROFEN 600 MG PO TABS
600.0000 mg | ORAL_TABLET | Freq: Four times a day (QID) | ORAL | 0 refills | Status: AC | PRN
Start: 2021-05-29 — End: ?

## 2021-05-29 MED ORDER — BENZONATATE 100 MG PO CAPS: 100.0000 mg | ORAL_CAPSULE | Freq: Three times a day (TID) | ORAL | 0 refills | Status: AC | PRN

## 2021-05-29 MED ORDER — ALBUTEROL SULFATE HFA 108 (90 BASE) MCG/ACT IN AERS: 1.0000 | INHALATION_SPRAY | Freq: Four times a day (QID) | RESPIRATORY_TRACT | 0 refills | Status: AC | PRN

## 2021-05-29 NOTE — ED Triage Notes (Signed)
Pt took a home covid test and was positive.

## 2021-05-29 NOTE — ED Triage Notes (Signed)
Pt presents with cough,congestion for 3 days. She does not report any fever at this time. She

## 2021-05-29 NOTE — Discharge Instructions (Signed)
Your Covid-19 test is positive.   Please follow the current CDC guidance (updated Dec. 27, 2021) below:  Stay at home for 5 days starting on 12/25 If you have no symptoms or your symptoms are improving after 5 days AND you have no fever for 24 hours, you can leave your house Please continue to wear a well-fitting face mask for 5 additional days when you are around other people. You may be able to return to work after day 5 if your symptoms are improving AND you have no fever.

## 2021-05-29 NOTE — ED Provider Notes (Signed)
[facility]    CSN: [id number] Arrival date & time: 05/29/21  3818      History   Chief Complaint Chief Complaint  Patient presents with   Cough   Nasal Congestion    HPI Jean Ferguson is a 44 y.o. female comes to the urgent care with 3-day history of nasal congestion, cough and generalized body aches.  Patient was around family during the Christmas holidays.  She denies any vomiting, abdominal pain or diarrhea.  Patient tested positive using the home COVID test.  She is vaccinated and boosted against COVID-19 virus HPI  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   Visual disturbance 02/25/2016   Headache 02/25/2016   Chronic blood loss anemia 02/06/2016   Menorrhagia 02/06/2016   Fibroid uterus 01/27/2016    Past Surgical History:  Procedure Laterality Date   CESAREAN SECTION  x3   last one 1997   Bilateral Tubal Ligation w/ last one   DILITATION & CURRETTAGE/HYSTROSCOPY WITH HYDROTHERMAL ABLATION N/A 03/30/2017   Procedure: DILATATION & CURETTAGE/HYSTEROSCOPY WITH HYDROTHERMAL ABLATION;  Surgeon: Donnamae Jude, MD;  Location: [place];  Service: Gynecology;  Laterality: N/A;   LAPAROSCOPIC VAGINAL HYSTERECTOMY WITH SALPINGECTOMY Bilateral 09/22/2019   Procedure: LAPAROSCOPIC ASSISTED VAGINAL HYSTERECTOMY WITH SALPINGECTOMY;  Surgeon: Crawford Givens, MD;  Location: [place];  Service: Gynecology;  Laterality: Bilateral;    OB History     Gravida  3   Para  3   Term      Preterm  3   AB      Living  3      SAB      IAB      Ectopic      Multiple      Live Births  3            Home Medications    Prior to Admission medications   Medication Sig Start Date End Date  Taking? Authorizing Provider  albuterol (VENTOLIN HFA) 108 (90 Base) MCG/ACT inhaler Inhale 1-2 puffs into the lungs every 6 (six) hours as needed for wheezing or shortness of breath. 05/29/21  Yes Harnoor Reta, Myrene Galas, MD  benzonatate (TESSALON) 100 MG capsule Take 1 capsule (100 mg total) by mouth 3 (three) times daily as needed for cough. 05/29/21  Yes Kajuan Guyton, Myrene Galas, MD  ibuprofen (ADVIL) 600 MG tablet Take 1 tablet (600 mg total) by mouth every 6 (six) hours as needed. 05/29/21  Yes Rayme Bui, Myrene Galas, MD  amLODipine (NORVASC) 5 MG tablet Take 5 mg by mouth at bedtime. 03/21/19   [provider]  cetirizine (ZYRTEC ALLERGY) 10 MG tablet Take 1 tablet (10 mg total) by mouth daily. 06/09/20   Jaynee Eagles, PA-C  docusate sodium (COLACE) 100 MG capsule Take 1 capsule (100 mg total) by mouth 2 (two) times daily. 09/23/19   Dillard, Gwynneth Munson, MD  fluticasone (FLONASE) 50 MCG/ACT nasal spray Place 1 spray into both nostrils daily. 12/25/20   Raspet, Derry Skill, PA-C  hydrochlorothiazide (HYDRODIURIL) 12.5 MG tablet Take 12.5 mg by mouth every morning. 03/21/19   [provider]  ondansetron (ZOFRAN-ODT) 4 MG disintegrating tablet Take 1 tablet (4 mg  total) by mouth every 8 (eight) hours as needed for nausea or vomiting. 05/16/20   Vanessa Kick, MD  oxyCODONE-acetaminophen (PERCOCET/ROXICET) 5-325 MG tablet Take 1-2 tablets by mouth every 4 (four) hours as needed for moderate pain. 09/23/19   Crawford Givens, MD  famotidine (PEPCID) 20 MG tablet Take 1 tablet (20 mg total) by mouth 2 (two) times daily. Patient not taking: Reported on 03/17/2018 01/26/18 12/30/18  Carlisle Cater, PA-C    Family History Family History  Problem Relation Age of Onset   Diabetes Mother    Lung cancer Father    Diabetes Father    Heart attack Paternal Grandfather    Stroke Paternal Uncle    Diabetes Paternal Uncle    Heart attack Paternal Uncle        x 3 brothers   Stroke Paternal Aunt    Diabetes Paternal Aunt     Heart attack Paternal Aunt     Social History Social History   Tobacco Use   Smoking status: Former    Packs/day: 0.25    Years: 15.00    Pack years: 3.75    Types: Cigarettes   Smokeless tobacco: Never   Tobacco comments:    quit August 08, 2018  Vaping Use   Vaping Use: Never used  Substance Use Topics   Alcohol use: Yes    Comment: occasional   Drug use: No     Allergies   Augmentin [amoxicillin-pot clavulanate] and Morphine and related   Review of Systems Review of Systems  HENT:  Positive for congestion. Negative for postnasal drip, rhinorrhea and sore throat.   Respiratory:  Positive for cough. Negative for chest tightness, shortness of breath and wheezing.   Gastrointestinal:  Negative for abdominal pain, diarrhea and nausea.  Musculoskeletal:  Positive for back pain and myalgias.  Skin: Negative.     Physical Exam Triage Vital Signs ED Triage Vitals [05/29/21 0905]  Enc Vitals Group     BP      Pulse      Resp      Temp      Temp src      SpO2      Weight      Height      Head Circumference      Peak Flow      Pain Score 4     Pain Loc      Pain Edu?      Excl. in [place]?    No data found.  Updated Vital Signs LMP  (LMP Unknown) Comment: per pt has bleed since last year 2017 so unsure   Visual Acuity Right Eye Distance:   Left Eye Distance:   Bilateral Distance:    Right Eye Near:   Left Eye Near:    Bilateral Near:     Physical Exam Vitals and nursing note reviewed.  Constitutional:      General: She is not in acute distress.    Appearance: She is ill-appearing.  HENT:     Right Ear: Tympanic membrane normal.     Left Ear: Tympanic membrane normal.  Cardiovascular:     Rate and Rhythm: Normal rate and regular rhythm.     Pulses: Normal pulses.     Heart sounds: Normal heart sounds.  Pulmonary:     Effort: Pulmonary effort is normal.     Breath sounds: Normal breath sounds.  Abdominal:     General: Bowel sounds are normal.      Palpations:  Abdomen is soft.  Neurological:     Mental Status: She is alert.     UC Treatments / Results  Labs (all labs ordered are listed, but only abnormal results are displayed) Labs Reviewed - No data to display  EKG   Radiology No results found.  Procedures Procedures (including critical care time)  Medications Ordered in UC Medications - No data to display  Initial Impression / Assessment and Plan / UC Course  I have reviewed the triage vital signs and the nursing notes.  Pertinent labs & imaging results that were available during my care of the patient were reviewed by me and considered in my medical decision making (see chart for details).     COVID-19 infection: No indication for antiviral agents Tessalon Perles as needed for cough Maintain adequate hydration Albuterol inhaler as needed for wheezing/shortness of breath Ibuprofen as needed for pain  Final Clinical Impressions(s) / UC Diagnoses   Final diagnoses:  COVID-19 virus infection     Discharge Instructions      Your Covid-19 test is positive.   Please follow the current CDC guidance (updated Dec. 27, 2021) below:  Stay at home for 5 days starting on 12/25 If you have no symptoms or your symptoms are improving after 5 days AND you have no fever for 24 hours, you can leave your house Please continue to wear a well-fitting face mask for 5 additional days when you are around other people. You may be able to return to work after day 5 if your symptoms are improving AND you have no fever.       ED Prescriptions     Medication Sig Dispense Auth. Provider   albuterol (VENTOLIN HFA) 108 (90 Base) MCG/ACT inhaler Inhale 1-2 puffs into the lungs every 6 (six) hours as needed for wheezing or shortness of breath. 18 g Annaly Skop, Myrene Galas, MD   benzonatate (TESSALON) 100 MG capsule Take 1 capsule (100 mg total) by mouth 3 (three) times daily as needed for cough. 21 capsule Ferron Ishmael, Myrene Galas, MD    ibuprofen (ADVIL) 600 MG tablet Take 1 tablet (600 mg total) by mouth every 6 (six) hours as needed. 30 tablet Laiden Milles, Myrene Galas, MD      PDMP not reviewed this encounter.   Chase Picket, MD 05/29/21 [phone]

## 2023-02-24 ENCOUNTER — Other Ambulatory Visit: Payer: Self-pay | Admitting: Family Medicine

## 2023-02-24 DIAGNOSIS — G44029 Chronic cluster headache, not intractable: Secondary | ICD-10-CM
